# Patient Record
Sex: Female | Born: 1960 | Hispanic: Yes | Smoking: Never smoker
Health system: Southern US, Community
[De-identification: ages and names within clinical notes are randomized; demographics above are authoritative.]

## PROBLEM LIST (undated history)

## (undated) DIAGNOSIS — R519 Headache, unspecified: Secondary | ICD-10-CM

## (undated) DIAGNOSIS — G35 Multiple sclerosis: Secondary | ICD-10-CM

## (undated) DIAGNOSIS — J45909 Unspecified asthma, uncomplicated: Secondary | ICD-10-CM

## (undated) DIAGNOSIS — M810 Age-related osteoporosis without current pathological fracture: Secondary | ICD-10-CM

## (undated) DIAGNOSIS — N200 Calculus of kidney: Secondary | ICD-10-CM

## (undated) DIAGNOSIS — K219 Gastro-esophageal reflux disease without esophagitis: Secondary | ICD-10-CM

## (undated) HISTORY — DX: Age-related osteoporosis without current pathological fracture: M81.0

## (undated) HISTORY — DX: Multiple sclerosis: G35

## (undated) HISTORY — DX: Headache, unspecified: R51.9

## (undated) HISTORY — PX: HYSTERECTOMY: SHX81

## (undated) HISTORY — DX: Unspecified asthma, uncomplicated: J45.909

## (undated) HISTORY — DX: Gastro-esophageal reflux disease without esophagitis: K21.9

## (undated) HISTORY — PX: ABDOMINAL HYSTERECTOMY: SHX81

---

## 1985-10-21 HISTORY — PX: PR ANES; ANESTH, CS HYSTERECTOMY: AN01963

## 1998-11-23 ENCOUNTER — Emergency Department (HOSPITAL_COMMUNITY): Admission: EM | Admit: 1998-11-23 | Discharge: 1998-11-23 | Payer: Self-pay | Admitting: Emergency Medicine

## 2002-07-21 ENCOUNTER — Ambulatory Visit (HOSPITAL_COMMUNITY): Admission: RE | Admit: 2002-07-21 | Discharge: 2002-07-21 | Payer: Self-pay | Admitting: Family Medicine

## 2002-07-21 ENCOUNTER — Encounter: Payer: Self-pay | Admitting: Family Medicine

## 2002-11-19 ENCOUNTER — Ambulatory Visit (HOSPITAL_COMMUNITY): Admission: RE | Admit: 2002-11-19 | Discharge: 2002-11-19 | Payer: Self-pay | Admitting: Neurology

## 2002-12-07 ENCOUNTER — Ambulatory Visit (HOSPITAL_COMMUNITY): Admission: RE | Admit: 2002-12-07 | Discharge: 2002-12-07 | Payer: Self-pay | Admitting: Neurology

## 2005-05-24 ENCOUNTER — Ambulatory Visit: Payer: Self-pay | Admitting: Cardiology

## 2005-06-27 ENCOUNTER — Encounter: Admission: RE | Admit: 2005-06-27 | Discharge: 2005-09-25 | Payer: Self-pay | Admitting: Family Medicine

## 2009-04-01 ENCOUNTER — Emergency Department (HOSPITAL_COMMUNITY): Admission: EM | Admit: 2009-04-01 | Discharge: 2009-04-01 | Payer: Self-pay | Admitting: Emergency Medicine

## 2011-10-22 HISTORY — PX: PR CHOLECYSTECTOMY: 47600

## 2013-03-20 ENCOUNTER — Encounter (HOSPITAL_COMMUNITY): Payer: Self-pay | Admitting: *Deleted

## 2013-03-20 ENCOUNTER — Emergency Department (HOSPITAL_COMMUNITY): Payer: BC Managed Care – PPO

## 2013-03-20 ENCOUNTER — Emergency Department (HOSPITAL_COMMUNITY)
Admission: EM | Admit: 2013-03-20 | Discharge: 2013-03-20 | Disposition: A | Discharge status: Home / Self Care | Payer: BC Managed Care – PPO | Attending: Emergency Medicine | Admitting: Emergency Medicine

## 2013-03-20 DIAGNOSIS — Z90710 Acquired absence of both cervix and uterus: Secondary | ICD-10-CM | POA: Insufficient documentation

## 2013-03-20 DIAGNOSIS — R5381 Other malaise: Secondary | ICD-10-CM | POA: Insufficient documentation

## 2013-03-20 DIAGNOSIS — Z880 Allergy status to penicillin: Secondary | ICD-10-CM | POA: Insufficient documentation

## 2013-03-20 DIAGNOSIS — Z87442 Personal history of urinary calculi: Secondary | ICD-10-CM | POA: Insufficient documentation

## 2013-03-20 DIAGNOSIS — R109 Unspecified abdominal pain: Secondary | ICD-10-CM | POA: Insufficient documentation

## 2013-03-20 DIAGNOSIS — R42 Dizziness and giddiness: Secondary | ICD-10-CM | POA: Insufficient documentation

## 2013-03-20 DIAGNOSIS — J45909 Unspecified asthma, uncomplicated: Secondary | ICD-10-CM | POA: Insufficient documentation

## 2013-03-20 DIAGNOSIS — Z8669 Personal history of other diseases of the nervous system and sense organs: Secondary | ICD-10-CM | POA: Insufficient documentation

## 2013-03-20 HISTORY — DX: Calculus of kidney: N20.0

## 2013-03-20 LAB — CBC WITH DIFFERENTIAL/PLATELET
Basophils Absolute: 0 10*3/uL (ref 0.0–0.1)
Basophils Relative: 0 % (ref 0–1)
Eosinophils Absolute: 0.1 10*3/uL (ref 0.0–0.7)
Eosinophils Relative: 2 % (ref 0–5)
HCT: 36.8 % (ref 36.0–46.0)
Hemoglobin: 12.6 g/dL (ref 12.0–15.0)
Lymphocytes Relative: 51 % — ABNORMAL HIGH (ref 12–46)
Lymphs Abs: 2.5 10*3/uL (ref 0.7–4.0)
MCH: 30.2 pg (ref 26.0–34.0)
MCHC: 34.2 g/dL (ref 30.0–36.0)
MCV: 88.2 fL (ref 78.0–100.0)
Monocytes Absolute: 0.3 10*3/uL (ref 0.1–1.0)
Monocytes Relative: 7 % (ref 3–12)
Neutro Abs: 2 10*3/uL (ref 1.7–7.7)
Neutrophils Relative %: 40 % — ABNORMAL LOW (ref 43–77)
Platelets: 240 10*3/uL (ref 150–400)
RBC: 4.17 MIL/uL (ref 3.87–5.11)
RDW: 12.9 % (ref 11.5–15.5)
WBC: 4.9 10*3/uL (ref 4.0–10.5)

## 2013-03-20 LAB — URINALYSIS, ROUTINE W REFLEX MICROSCOPIC
Bilirubin Urine: NEGATIVE
Glucose, UA: NEGATIVE mg/dL
Hgb urine dipstick: NEGATIVE
Ketones, ur: NEGATIVE mg/dL
Leukocytes, UA: NEGATIVE
Nitrite: NEGATIVE
Protein, ur: NEGATIVE mg/dL
Specific Gravity, Urine: 1.025 (ref 1.005–1.030)
Urobilinogen, UA: 0.2 mg/dL (ref 0.0–1.0)
pH: 6 (ref 5.0–8.0)

## 2013-03-20 LAB — COMPREHENSIVE METABOLIC PANEL
ALT: 16 U/L (ref 0–35)
AST: 17 U/L (ref 0–37)
Albumin: 3.6 g/dL (ref 3.5–5.2)
Alkaline Phosphatase: 72 U/L (ref 39–117)
BUN: 10 mg/dL (ref 6–23)
CO2: 25 mEq/L (ref 19–32)
Calcium: 8.7 mg/dL (ref 8.4–10.5)
Chloride: 107 mEq/L (ref 96–112)
Creatinine, Ser: 0.46 mg/dL — ABNORMAL LOW (ref 0.50–1.10)
GFR calc Af Amer: >90 mL/min (ref >90)
GFR calc non Af Amer: >90 mL/min (ref >90)
Glucose, Bld: 96 mg/dL (ref 70–99)
Potassium: 3.7 mEq/L (ref 3.5–5.1)
Sodium: 141 mEq/L (ref 135–145)
Total Bilirubin: 0.1 mg/dL — ABNORMAL LOW (ref 0.3–1.2)
Total Protein: 6.7 g/dL (ref 6.0–8.3)

## 2013-03-20 LAB — LIPASE, BLOOD: Lipase: 67 U/L — ABNORMAL HIGH (ref 11–59)

## 2013-03-20 MED ORDER — ONDANSETRON HCL 4 MG/2ML IJ SOLN: 4.0000 mg | Freq: Once | INTRAMUSCULAR | Status: AC

## 2013-03-20 MED ORDER — FAMOTIDINE 20 MG PO TABS: 20.0000 mg | ORAL_TABLET | Freq: Two times a day (BID) | ORAL | Status: DC

## 2013-03-20 MED ORDER — IOHEXOL 300 MG/ML  SOLN
50.0000 mL | Freq: Once | INTRAMUSCULAR | Status: AC | PRN
  Administered 2013-03-20: 50 mL via ORAL

## 2013-03-20 MED ORDER — ONDANSETRON HCL 4 MG/2ML IJ SOLN
INTRAMUSCULAR | Status: AC
  Administered 2013-03-20: 4 mg via INTRAVENOUS
  Filled 2013-03-20: qty 2

## 2013-03-20 MED ORDER — SODIUM CHLORIDE 0.9 % IV BOLUS (SEPSIS)
1000.0000 mL | Freq: Once | INTRAVENOUS | Status: AC
  Administered 2013-03-20: 1000 mL via INTRAVENOUS

## 2013-03-20 MED ORDER — TRAMADOL HCL 50 MG PO TABS: 50.0000 mg | ORAL_TABLET | Freq: Four times a day (QID) | ORAL | Status: DC | PRN

## 2013-03-20 MED ORDER — HYDROMORPHONE HCL PF 1 MG/ML IJ SOLN: 1.0000 mg | Freq: Once | INTRAMUSCULAR | Status: DC

## 2013-03-20 MED ORDER — IOHEXOL 300 MG/ML  SOLN
100.0000 mL | Freq: Once | INTRAMUSCULAR | Status: AC | PRN
  Administered 2013-03-20: 100 mL via INTRAVENOUS

## 2013-03-20 MED ORDER — HYDROMORPHONE HCL PF 1 MG/ML IJ SOLN
1.0000 mg | Freq: Once | INTRAMUSCULAR | Status: AC
  Administered 2013-03-20: 1 mg via INTRAVENOUS
  Filled 2013-03-20: qty 1

## 2013-03-20 NOTE — ED Notes (Signed)
Pt c/o right flank pain that radiates around to right abd area. Associated with nausea, has hx of kidney stones

## 2013-03-20 NOTE — ED Notes (Signed)
Verbal order for 1 mg Dilaudid obtained from Dr Hyacinth Meeker.

## 2013-03-20 NOTE — ED Notes (Signed)
Assisted patient to restroom. Returned to room. No distress noted.

## 2013-03-20 NOTE — ED Provider Notes (Signed)
History    This chart was scribed for Vida Roller, MD by Leone Payor, ED Scribe. This patient was seen in room [facility]18[facility] and the patient's care was started 11:58 AM.   CSN: [id number]  Arrival date & time 03/20/13  1106   First MD Initiated Contact with Patient 03/20/13 1130      Chief Complaint  Patient presents with  . Flank Pain     The history is provided by the patient. No language interpreter was used.    HPI Comments: Ariel Burns is a 52 y.o. female who presents to the Emergency Department complaining of gradual onset, persistent RUQ and R flank pain that started 2-3 days ago. States the pain worsened with eating this morning. Pt has h/o MS, asthma, abdominal surgery (hysterectomy), kidney stones. She denies having h/o kidney infection. No prior abd surgery other than hysterectomy.  No alcohol use.      Past Medical History  Diagnosis Date  . Kidney stones     Past Surgical History  Procedure Laterality Date  . Abdominal hysterectomy      No family history on file.  History  Substance Use Topics  . Smoking status: Not on file  . Smokeless tobacco: Not on file  . Alcohol Use: No    OB History   Grav Para Term Preterm Abortions TAB SAB Ect Mult Living                  Review of Systems A complete 10 system review of systems was obtained and all systems are negative except as noted in the HPI and PMH.   Allergies  Penicillins  Home Medications   Current Outpatient Rx  Name  Route  Sig  Dispense  Refill  . famotidine (PEPCID) 20 MG tablet   Oral   Take 1 tablet (20 mg total) by mouth 2 (two) times daily.   30 tablet   0   . traMADol (ULTRAM) 50 MG tablet   Oral   Take 1 tablet (50 mg total) by mouth every 6 (six) hours as needed for pain.   15 tablet   0     BP 122/54  Pulse 67  Temp(Src) 98.2 F (36.8 C) (Oral)  Resp 20  Ht 5\' 2"  (1.575 m)  Wt 120 lb (54.432 kg)  BMI 21.94 kg/m2  SpO2 99%  Physical Exam  Nursing note and  vitals reviewed. Constitutional: She is oriented to person, place, and time. She appears well-developed and well-nourished. No distress.  HENT:  Head: Normocephalic and atraumatic.  Eyes: EOM are normal.  Neck: Neck supple. No tracheal deviation present.  Cardiovascular: Normal rate, regular rhythm and normal heart sounds.   Pulmonary/Chest: Effort normal and breath sounds normal. No respiratory distress.  Abdominal: Soft. There is tenderness. There is guarding.  Epigastric and RUQ tenderness with guarding. Soft and non-peritoneal. No HSM.   Musculoskeletal: Normal range of motion.  Neurological: She is alert and oriented to person, place, and time.  Skin: Skin is warm and dry.  Psychiatric: She has a normal mood and affect. Her behavior is normal.    ED Course  Procedures (including critical care time)  DIAGNOSTIC STUDIES: Oxygen Saturation is 99% on room air, normal by my interpretation.    COORDINATION OF CARE: 11:55 AM Discussed treatment plan with pt at bedside and pt agreed to plan.   Labs Reviewed  CBC WITH DIFFERENTIAL - Abnormal; Notable for the following:    Neutrophils Relative % 40 (*)  Lymphocytes Relative 51 (*)    All other components within normal limits  COMPREHENSIVE METABOLIC PANEL - Abnormal; Notable for the following:    Creatinine, Ser 0.46 (*)    Total Bilirubin 0.1 (*)    All other components within normal limits  LIPASE, BLOOD - Abnormal; Notable for the following:    Lipase 67 (*)    All other components within normal limits  URINALYSIS, ROUTINE W REFLEX MICROSCOPIC   Ct Abdomen Pelvis W Contrast  03/20/2013   *RADIOLOGY REPORT*  Clinical Data: Right-sided abdominal/flank pain.  History multiple sclerosis.  History kidney stones and prior hysterectomy.  CT ABDOMEN AND PELVIS WITH CONTRAST  Technique:  Multidetector CT imaging of the abdomen and pelvis was performed following the standard protocol during bolus administration of intravenous contrast.   Contrast: 50mL OMNIPAQUE IOHEXOL 300 MG/ML  SOLN, OMNIPAQUE IOHEXOL 300 MG/ML  SOLN  Comparison: None  Findings: Lung bases:  Mild motion degradation at the lung bases. Heart size upper normal, without pericardial or pleural effusion. The distal esophagus is mildly dilated and contrast filled on image 4.  Abdomen/pelvis:  Scattered well-circumscribed tiny low density liver lesions which are likely cysts or bile duct hamartomas. Normal spleen, stomach, pancreas, gallbladder, biliary tract, adrenal glands, left kidney.  Lower pole right renal cyst of 1.1 cm.  Aortic atherosclerosis. No retroperitoneal or retrocrural adenopathy.  Scattered colonic diverticula.  Normal terminal ileum and appendix. Normal small bowel without abdominal ascites.    No pelvic adenopathy.    Normal urinary bladder.  Hysterectomy. No adnexal mass.  No significant free fluid.  Bones/Musculoskeletal:  Mild osteopenia.  IMPRESSION: No acute process or explanation for right-sided abdominal pain.  Dilated fluid-filled lower thoracic esophagus, suggesting dysmotility or gastroesophageal reflux.   Original Report Authenticated By: Jeronimo Greaves, M.D.     1. Abdominal pain       MDM  On repeat exam, more soft, less tender, well appearing, nrmal VS and normal labs - CT shows no signs of acute surgical problems.  i have given pt dilaudid but she became very weak and dizzy with this - will give pt ultram for home as needed pepcid for GERD / gastritis and have f/u.  She has expressed unerstanding.  Meds given in ED:  Medications  HYDROmorphone (DILAUDID) injection 1 mg (1 mg Intravenous Given 03/20/13 1216)  sodium chloride 0.9 % bolus 1,000 mL (0 mLs Intravenous Stopped 03/20/13 1331)  ondansetron (ZOFRAN) injection 4 mg (4 mg Intravenous Given 03/20/13 1223)  iohexol (OMNIPAQUE) 300 MG/ML solution 50 mL (50 mLs Oral Contrast Given 03/20/13 1502)  iohexol (OMNIPAQUE) 300 MG/ML solution 100 mL (100 mLs Intravenous Contrast Given  03/20/13 1502)    New Prescriptions   FAMOTIDINE (PEPCID) 20 MG TABLET    Take 1 tablet (20 mg total) by mouth 2 (two) times daily.   TRAMADOL (ULTRAM) 50 MG TABLET    Take 1 tablet (50 mg total) by mouth every 6 (six) hours as needed for pain.      I personally performed the services described in this documentation, which was scribed in my presence. The recorded information has been reviewed and is accurate.      Vida Roller, MD 03/20/13 [phone]

## 2013-03-20 NOTE — ED Notes (Signed)
Patient stating shortness of breath after receiving Dilaudid IV, no hives noted. VSS, patient placed on monitor. NSR at 72 bpm. Patient moaning, but states her pain is better. MD aware. No new orders received.

## 2013-03-20 NOTE — ED Notes (Signed)
Patient with no complaints at this time. Respirations even and unlabored. Skin warm/dry. Discharge instructions reviewed with patient at this time. Patient given opportunity to voice concerns/ask questions. IV removed per policy and band-aid applied to site. Patient discharged at this time and left Emergency Department via wheelchair.  

## 2013-03-20 NOTE — ED Notes (Signed)
Pt c/o nausea after dilaudid given. MD aware. Verbal order for 4 mg Zofran obtained.

## 2013-03-29 ENCOUNTER — Ambulatory Visit (INDEPENDENT_AMBULATORY_CARE_PROVIDER_SITE_OTHER): Payer: BC Managed Care – PPO | Admitting: Family Medicine

## 2013-03-29 ENCOUNTER — Encounter: Payer: Self-pay | Admitting: Family Medicine

## 2013-03-29 VITALS — BP 107/63 | HR 66 | Temp 97.3°F | Ht 61.0 in | Wt 124.2 lb

## 2013-03-29 DIAGNOSIS — G8929 Other chronic pain: Secondary | ICD-10-CM

## 2013-03-29 DIAGNOSIS — R1011 Right upper quadrant pain: Secondary | ICD-10-CM

## 2013-03-29 MED ORDER — TRAMADOL HCL 50 MG PO TABS: 50.0000 mg | ORAL_TABLET | Freq: Four times a day (QID) | ORAL | Status: DC | PRN

## 2013-03-29 NOTE — Patient Instructions (Addendum)
Colecistitis   (Cholecystitis)   La colecistitis es la inflamación de la vesícula biliar. Generalmente la causa es la formación de cálculos biliares o sedimentos (colelitiasis)) en la vesícula. La vesícula almacena un líquido que ayuda a digerir las grasas (bilis). La colecistitis es una enfermedad grave y requiere tratamiento inmediato.   CAUSAS   · Cálculos biliares. Los cálculos biliares pueden obstruir el conducto que conduce a la vesícula biliar, causando la acumulación de bilis. Cuando la bilis se acumula, la vesícula biliar se inflama.  · Problemas en el conducto biliar, como la obstrucción por cicatrización o torsión.  · Tumores. Los tumores pueden impedir que la bilis salga de la vesícula adecuadamente, causando la acumulación de la misma. Cuando la bilis se acumula, la vesícula se inflama.  SÍNTOMAS   · Náuseas  · Vómitos.  · Dolor abdominal, especialmente en la zona superior derecha del abdomen.  · Sensibilidad o hinchazón abdominal.  · Sudoración.  · Escalofríos.  · Fiebre.  · Color amarillo de la piel y en la zona blanca del ojo (ictericia).  DIAGNÓSTICO   Su médico puede indicar exámenes de sangre para detectar una infección o problemas en la vesícula biliar. También puede ordenar pruebas de diagnóstico por imágenes, como ecografías o tomografía computada (CT). Otras pruebas pueden incluir un estudio de gammagrafía hepatobiliar con ácido iminodiacético (HIDA). Esta exploración permite a su médico ver el paso de la bilis desde el hígado hasta la vesícula biliar y el intestino delgado.   TRATAMIENTO   La hospitalización suele ser necesaria para disminuir la inflamación de la vesícula biliar. Posiblemente le indiquen que no coma ni beba nada (ayuno) durante cierto período de tiempo. Podrán indicarle un medicamento para calmar el dolor o un antibiótico para tratar la infección. Puede ser necesario realizar una cirugía para extirpar la vesícula biliar (colecistectomía) cuando la inflamación haya disminuído.  Podría necesitar de inmediato una cirugía si aparecen complicaciones como la muerte del tejido de la vesícula biliar (gangrena) o la ruptura (perforación)) de la vesícula biliar.   INSTRUCCIONES PARA EL CUIDADO EN EL HOGAR   El cuidado en el hogar dependerá del tipo de tratamiento. En general:   · Si le han recetado antibióticos, tómelos según las indicaciones. Tómelos todos, aunque se sienta mejor.  · Solo tome medicamentos de venta libre o recetados para el dolor, malestar o fiebre, según las indicaciones del médico.  · Siga una dieta baja en grasas hasta que vuelva a ver al médico nuevamente.  · Cumpla con todas las visitas de control, según le indique su médico.  SOLICITE ATENCIÓN MÉDICA DE INMEDIATO SI:   · El dolor aumenta y no puede controlarlo con los medicamentos.  · El dolor se traslada hacia alguna otra zona del abdomen o hacia la espalda.  · Tiene fiebre.  · Tiene náuseas o vómitos.  ASEGÚRESE DE QUE:   · Comprende estas instrucciones.  · Controlará su enfermedad.  · Solicitará ayuda de inmediato si no mejora o si empeora.  Document Released: 07/17/2005 Document Revised: 12/30/2011  [employer]® Patient Information ©2014 [employer].

## 2013-03-29 NOTE — Progress Notes (Signed)
  Subjective:    Patient ID: Ariel Burns, female    DOB: Dec 09, 1960, 52 y.o.   MRN: [id number]  HPI This 52 y.o. female presents for evaluation of abdominal pain.  She was seen in the ED a few weeks ago and was diagnosed with gastritis.  She was rx'd tramadol and pepcid.  She states she is still having abdominal pain which radiates to her right abdomen and happens after eating.  She is denying any fever but c/o nausea.    Review of Systems  Constitutional: Positive for fatigue. Negative for fever.  HENT: Negative.   Eyes: Negative.   Respiratory: Negative.   Cardiovascular: Negative.   Gastrointestinal: Positive for nausea and abdominal pain.        Objective:   Physical Exam Vital signs noted  Well developed well nourished female.  HEENT - Head atraumatic Normocephalic                Eyes - PERRLA, Conjuctiva - clear Sclera- Clear EOMI                Ears - EAC's Wnl TM's Wnl Gross Hearing WNL                Nose - Nares patent                 Throat - oropharanx wnl Respiratory - Lungs CTA bilateral Cardiac - RRR S1 and S2 without murmur GI - Positive Murphy's soft otherwise. Extremities - No edema. Neuro - Grossly intact.        Assessment & Plan:  Abdominal pain, chronic, right upper quadrant - Plan: traMADol (ULTRAM) 50 MG tablet, US Abdomen Limited RUQ  Discussed with patient that if her pain is persistent and worsens or if she gets fever then go to ED.  Follow up in one month

## 2013-03-31 ENCOUNTER — Ambulatory Visit (HOSPITAL_COMMUNITY)
Admission: RE | Admit: 2013-03-31 | Discharge: 2013-03-31 | Disposition: A | Discharge status: Home / Self Care | Payer: BC Managed Care – PPO | Source: Ambulatory Visit | Attending: Family Medicine | Admitting: Family Medicine

## 2013-03-31 ENCOUNTER — Other Ambulatory Visit: Payer: Self-pay | Admitting: Family Medicine

## 2013-03-31 DIAGNOSIS — G8929 Other chronic pain: Secondary | ICD-10-CM

## 2013-03-31 DIAGNOSIS — K8020 Calculus of gallbladder without cholecystitis without obstruction: Secondary | ICD-10-CM | POA: Insufficient documentation

## 2013-03-31 DIAGNOSIS — R1011 Right upper quadrant pain: Secondary | ICD-10-CM | POA: Insufficient documentation

## 2013-03-31 DIAGNOSIS — K7689 Other specified diseases of liver: Secondary | ICD-10-CM | POA: Insufficient documentation

## 2013-04-08 ENCOUNTER — Ambulatory Visit (INDEPENDENT_AMBULATORY_CARE_PROVIDER_SITE_OTHER): Payer: BC Managed Care – PPO | Admitting: General Surgery

## 2013-04-12 DIAGNOSIS — Z8719 Personal history of other diseases of the digestive system: Secondary | ICD-10-CM | POA: Insufficient documentation

## 2013-04-12 DIAGNOSIS — G35D Multiple sclerosis, unspecified: Secondary | ICD-10-CM | POA: Insufficient documentation

## 2013-04-12 DIAGNOSIS — G35 Multiple sclerosis: Secondary | ICD-10-CM | POA: Insufficient documentation

## 2013-04-12 DIAGNOSIS — J45909 Unspecified asthma, uncomplicated: Secondary | ICD-10-CM | POA: Insufficient documentation

## 2013-04-12 DIAGNOSIS — K8020 Calculus of gallbladder without cholecystitis without obstruction: Secondary | ICD-10-CM | POA: Insufficient documentation

## 2013-04-28 ENCOUNTER — Encounter: Payer: Self-pay | Admitting: Family Medicine

## 2013-04-28 ENCOUNTER — Ambulatory Visit (INDEPENDENT_AMBULATORY_CARE_PROVIDER_SITE_OTHER): Payer: BC Managed Care – PPO | Admitting: Family Medicine

## 2013-04-28 VITALS — BP 104/64 | HR 65 | Temp 97.6°F | Wt 118.8 lb

## 2013-04-28 DIAGNOSIS — H11001 Unspecified pterygium of right eye: Secondary | ICD-10-CM

## 2013-04-28 DIAGNOSIS — M549 Dorsalgia, unspecified: Secondary | ICD-10-CM

## 2013-04-28 DIAGNOSIS — H11009 Unspecified pterygium of unspecified eye: Secondary | ICD-10-CM

## 2013-04-28 MED ORDER — HYDROCODONE-ACETAMINOPHEN 5-325 MG PO TABS: 1.0000 | ORAL_TABLET | Freq: Four times a day (QID) | ORAL | Status: DC | PRN

## 2013-04-28 MED ORDER — NAPROXEN 500 MG PO TABS
500.0000 mg | ORAL_TABLET | Freq: Two times a day (BID) | ORAL | Status: DC
Start: 2013-04-28 — End: 2013-07-29

## 2013-04-28 NOTE — Progress Notes (Signed)
  Subjective:    Patient ID: Ariel Burns, female    DOB: 11/23/1960, 52 y.o.   MRN: [id number]  HPI  This 52 y.o. female presents for evaluation of back pain and s/p cholycystectomy.  She had her cholycystectomy on 04/13/13.  She has been having some right abdominal and back pain.  She Has been having some indigestion.  She doesn't feel like she can go back to work for a few more  Weeks.   Her job requires her to do a lot of lifting.  She has a pytergium OD and needs to get surgery on This and wants to wait.  Review of Systems    No chest pain, SOB, HA, dizziness, vision change, N/V, diarrhea, constipation, dysuria, urinary urgency or frequency, myalgias, arthralgias or rash.  Objective:   Physical Exam  Vital signs noted  Well developed well nourished female.  HEENT - Head atraumatic Normocephalic                Eyes - PERRLA, Conjuctiva - clear Sclera- Clear, OD with pytergium  EOMI                Ears - EAC's Wnl TM's Wnl Gross Hearing WNL                Nose - Nares patent                 Throat - oropharanx wnl Respiratory - Lungs CTA bilateral Cardiac - RRR S1 and S2 without murmur GI - Abdomen soft tender right upper quadrant, and bowel sounds active x 4 Extremities - No edema. Neuro - Grossly intact. MS-TTP LS spine.      Assessment & Plan:  Back pain - Plan: HYDROcodone-acetaminophen (NORCO/VICODIN) 5-325 MG per tablet, naproxen (NAPROSYN) 500 MG tablet x 10 days Work excuse given.  Discussed patient follow up if not better.  S/P cholycystectomy -  She still is having some post operative healing but is doing well.  Advised her to see her Surgeon on 05/07/13.  Pterygium - Advised her to go see her opthamologist and get this taken care of when better.

## 2013-04-28 NOTE — Patient Instructions (Signed)
Pterygium Excision Pterygia are fleshy growths that arise from the conjuctiva. This is the red velvety membrane you see when you pull your lower eyelid down. When they grow out over the cornea (clear membrane on the front of your eye), they block vision. It becomes difficult to see. It may also cause irritation, making the eye red and sore. It also causes cosmetic problems. This means your eye does not look as good as when it was healthy. One of the most common problems with pterygia are that they often come back even after complete removal. TREATMENT  Pterygia are removed with a procedure. This is often done with a local anesthetic. This is a medicine that makes the eye and area being worked on numb. They are often removed using a microscope. This is an instrument the surgeon looks through that magnifies the small area of the procedure. When these operations are done with the patient awake, the patient must be able to hold still and cooperate with the surgeon's instructions. HOME CARE INSTRUCTIONS   If a dressing was applied, this may be changed once per day or as instructed. Your caregiver will instruct you in your care.  If eyedrops or ointment was prescribed, use as directed for the full time directed.  Should your eye become more red and swollen with use of medicines, let your caregiver know. This could be an allergic reaction.  Only take over-the-counter or prescription medicines for pain, discomfort, or fever as directed by your caregiver. SEEK IMMEDIATE MEDICAL CARE IF:   You have redness, swelling, or increasing pain near or around the eye.  You notice a change in your vision.  You have pus coming from the wound.  You have a fever.  You develop a cough, shortness of breath, or chest pain. Document Released: 07/02/2001 Document Revised: 12/30/2011 Document Reviewed: 09/03/2007 [facility] Patient Information 2014 [place], [place].

## 2013-05-10 ENCOUNTER — Ambulatory Visit (INDEPENDENT_AMBULATORY_CARE_PROVIDER_SITE_OTHER): Payer: BC Managed Care – PPO | Admitting: General Surgery

## 2013-05-12 HISTORY — PX: CHOLECYSTECTOMY: SHX55

## 2013-07-29 ENCOUNTER — Ambulatory Visit (INDEPENDENT_AMBULATORY_CARE_PROVIDER_SITE_OTHER): Payer: BC Managed Care – PPO | Admitting: Family Medicine

## 2013-07-29 ENCOUNTER — Encounter: Payer: Self-pay | Admitting: Family Medicine

## 2013-07-29 ENCOUNTER — Ambulatory Visit (INDEPENDENT_AMBULATORY_CARE_PROVIDER_SITE_OTHER): Payer: BC Managed Care – PPO

## 2013-07-29 VITALS — BP 115/70 | HR 58 | Temp 97.0°F | Ht 61.0 in | Wt 122.0 lb

## 2013-07-29 DIAGNOSIS — M129 Arthropathy, unspecified: Secondary | ICD-10-CM

## 2013-07-29 DIAGNOSIS — M1990 Unspecified osteoarthritis, unspecified site: Secondary | ICD-10-CM

## 2013-07-29 DIAGNOSIS — M542 Cervicalgia: Secondary | ICD-10-CM

## 2013-07-29 DIAGNOSIS — R5381 Other malaise: Secondary | ICD-10-CM

## 2013-07-29 DIAGNOSIS — R232 Flushing: Secondary | ICD-10-CM

## 2013-07-29 DIAGNOSIS — N951 Menopausal and female climacteric states: Secondary | ICD-10-CM

## 2013-07-29 DIAGNOSIS — M549 Dorsalgia, unspecified: Secondary | ICD-10-CM

## 2013-07-29 DIAGNOSIS — H11009 Unspecified pterygium of unspecified eye: Secondary | ICD-10-CM

## 2013-07-29 DIAGNOSIS — H11002 Unspecified pterygium of left eye: Secondary | ICD-10-CM

## 2013-07-29 MED ORDER — MELOXICAM 7.5 MG PO TABS: 7.5000 mg | ORAL_TABLET | Freq: Every day | ORAL | Status: DC

## 2013-07-29 NOTE — Patient Instructions (Signed)
Artritis inespecífica  (Arthritis, Nonspecific)  La artritis es la inflamación de una articulación. Los síntomas son dolor, enrojecimiento, calor o hinchazón. Pueden verse involucradas una o más articulaciones. Hay diferentes tipos de artritis. El médico no podrá diagnosticar inmediatamente cuál es el tipo de artritis que usted sufre.   CAUSAS  La causa más frecuente es el desgaste de la articulación (osteoartritis). Esto ocasiona lesiones en el cartílago, que puede romperse con el tiempo. Las zonas más afectadas por este tipo de artritis son las rodillas, caderas, espalda y cuello.  Otros tipos de artritis y causas frecuentes de dolor en la articulación son:  · Esguinces y otras lesiones cercanas a la articulación}. En algunos casos, esguinces y lesiones menores causan dolor e hinchazón que aparece horas más tarde.  · Artritis reumatoidea Afecta las manos, pies y rodillas. Generalmente afecta ambos lados del cuerpo al mismo tiempo. Generalmente se asocia a enfermedades crónicas, fiebre, pérdida de peso y debilidad general.  · Artritis por cristales. La gota y la pseudogota pueden causar dolor intenso agudo ocasional, enrojecimiento e hinchazón del pie, el tobillo o la rodilla.  · Artritis infecciosa. Las bacterias pueden penetrar en la articulación a través de una herida en la piel. Esto puede causar una infección en la articulación. Las bacterias y virus también pueden diseminarse a través del torrente sanguíneo y afectar las articulaciones.  · Reacciones a medicamentos, infecciosas y alérgicas. En algunos casos las articulaciones duelen levemente y están ligeramente hinchadas en este tipo de enfermedad.  SÍNTOMAS  · El dolor es el síntoma principal.  · La articulación también pueden verse roja, hinchada y caliente al tacto.  · En ciertos tipos de artritis hay fiebre o malestar general.  · En la articulación que presenta artritis sentirá dolor con el movimiento. En otros tipos de artritis hay  rigidez.  DIAGNÓSTICO:  El médico sospechará artritis basándose en la descripción de los síntomas y en el examen. Será necesario realizar pruebas para diagnosticar el tipo de artritis.  · Análisis de sangre y en algunos casos de orina.  · Radiografías y en algunos casos tomografía computada o diagnóstico por imágenes.  · La remoción del líquido de la articulación (artrocentesis) se realiza para controlar la presencia de bacterias, cristales o por otras causas. Su médico (o un especialista) adormecerán la zona de la articulación con un anestésico local y utilizarán una aguja para retirar líquido de la articulación para ser examinado. Este procedimiento es sólo mínimamente molesto.  · Aún con estas pruebas, el médico no podrá decir qué tipo de artritis usted sufre. La consulta con un especialista (reumatólogo) puede ser de utilidad.  TRATAMIENTO  El médico comentará con usted el tratamiento específico para su tipo de artritis. Si el tipo específico no puede determinarse, podrán aplicarse las siguientes recomendaciones generales.   El tratamiento para el dolor intenso de las articulaciones consiste en:  · Hacer reposo  · Elevar el miembro.  · Podrán prescribirle medicamentos antiinflamatorios (como ibuprofeno). Evite las actividades que aumenten el dolor.  · Sólo tome medicamentos de venta libre o prescriptos para calmar el dolor y las molestias, según las indicaciones de su médico.  · Puede aplicarse compresas frías sobre la articulación dolorida durante 10 a 15 minutos cada hora. Las compresas calientes también pueden ser beneficiosas, pero no las utilice durante la noche. No use compresas calientes sin autorización de su médico, si es diabético.  · Una inyección de corticoides en la articulación artrítica puede ayudar a reducir el dolor y la hinchazón.  ·   Si una artritis aguda empeora en los siguientes 1 ó 2 días, será necesario descartar una infección.  El tratamiento prolongado implica la modificación de  actividades y del estilo de vida para reducir el estrés en la articulación. Puede ser necesario que baje de peso. La actividad física es necesaria para nutrir el cartílago de la articulación y eliminar los desechos. Esto ayuda a mantener fuertes los músculos que rodean la articulación.  INSTRUCCIONES PARA EL CUIDADO DOMICILIARIO  · No tome aspirina para aliviar el dolor si se sospecha que sufre gota. Esto eleva los niveles de ácido úrico.  · Solo tome medicamentos que se pueden comprar sin receta o recetados para el dolor, malestar o fiebre, como le indica el médico.  · Haga reposo todo el tiempo que pueda.  · Si la articulación está hinchada, manténgala elevada.  · Utilice muletas si la articulación que le duele está en la pierna.  · Beber abundante cantidad de líquidos será beneficioso para ciertos tipos de artritis.  · Siga las indicaciones del profesional.  · La actividad física regular puede ser beneficiosa, incluyendo las actividades de bajo impacto como:  · Natación.  · Aquagym.  · Andar en bicicleta.  · Caminar.  · La rigidez matutina se alivia con una ducha caliente.  · También es beneficioso que realice ejercicios de amplitud de movimiento.  SOLICITE ATENCIÓN MÉDICA SI:  · No se siente mejor o empeora luego de las 24 horas.  · Presenta efectos adversos por los medicamentos y no mejora con el tratamiento.  SOLICITE ATENCIÓN MÉDICA INMEDIATAMENTE SI:  · Tiene fiebre.  · Presenta fiebre o dolor intenso, hinchazón o enrojecimiento.  · Muchas articulaciones están involucradas y están hinchadas y siente dolor.  · Tiene un dolor intenso en la espalda o siente debilidad en las piernas.  · Pierde el control de la vejiga o del intestino.  Document Released: 10/07/2005 Document Revised: 12/30/2011  [employer]® Patient Information ©2014 [employer].

## 2013-07-29 NOTE — Progress Notes (Signed)
  Subjective:    Patient ID: Ariel Burns, female    DOB: 1960-11-10, 52 y.o.   MRN: [id number]  HPI This 52 y.o. female presents for evaluation of back pain.   She has this On occasion.  She is c/o multiple arthralgias.  She has neck discomfort. She has hx of MS.  She has been having vision problems.  She has pterygium Of the left eye.  She has been having some visual changes.  She has MS and  States she is not having a MS crisis.  She has been scheduled an appointment In January to see her MS doctor.  She has seen a RA in the past but did not Have seropositive RA criteria to be followed by RA for her multiple arthralgias. She is due for Mammo.  She has hx of partial hysterctomy in her 47's.  She Has been having some hot flashes and menopausal sx's.   Review of Systems C/o polyarthralgias,back pain, fatigue, hot flashes, and vision changes. No chest pain, SOB, HA, dizziness,  N/V, diarrhea, constipation, dysuria, urinary urgency or rash.     Objective:   Physical Exam  Vital signs noted  Well developed well nourished female.  HEENT - Head atraumatic Normocephalic                Eyes - PERRLA, Conjuctiva - clear Sclera- pterygium left eye                Ears - EAC's Wnl TM's Wnl Gross Hearing WNL                Nose - Nares patent                 Throat - oropharanx wnl Respiratory - Lungs CTA bilateral Cardiac - RRR S1 and S2 without murmur GI - Abdomen soft Nontender and bowel sounds active x 4 Extremities - No edema. Neuro - Grossly intact. MS - TTP lumbar, cervical spine,  TTP bilateral elbows, wrists, shoulders, and myofascial region  Lumbar spine xray - no acute fx Cervical spine xray - no acute fx   Prelimnary reading by Chrissie Noa Thamas Appleyard,FNP Assessment & Plan:  Pterygium, left - Plan: Ambulatory referral to Ophthalmology  Arthritis - Plan: meloxicam (MOBIC) 7.5 MG tablet, DG Cervical Spine Complete, DG Lumbar Spine 2-3 Views  Other malaise and fatigue -  Plan: BMP8+EGFR, Arthritis Panel, Vit D  25 hydroxy (rtn osteoporosis monitoring), Thyroid Panel With TSH, CANCELED: POCT CBC  Hot flashes - Plan: Follicle Stimulating Hormone  Deatra Canter FNP

## 2013-07-30 ENCOUNTER — Other Ambulatory Visit: Payer: Self-pay | Admitting: Family Medicine

## 2013-07-30 DIAGNOSIS — M25559 Pain in unspecified hip: Secondary | ICD-10-CM

## 2013-07-30 LAB — ARTHRITIS PANEL
Anti Nuclear Antibody(ANA): POSITIVE — AB
Rhuematoid fact SerPl-aCnc: 8.3 IU/mL (ref 0.0–13.9)
Sed Rate: 3 mm/hr (ref 0–40)
Uric Acid: 3.5 mg/dL (ref 2.5–7.1)

## 2013-07-30 LAB — FOLLICLE STIMULATING HORMONE: FSH: 147.9 m[IU]/mL

## 2013-07-30 LAB — BMP8+EGFR
BUN/Creatinine Ratio: 22 (ref 9–23)
BUN: 13 mg/dL (ref 6–24)
CO2: 27 mmol/L (ref 18–29)
Calcium: 9.6 mg/dL (ref 8.7–10.2)
Chloride: 100 mmol/L (ref 97–108)
Creatinine, Ser: 0.6 mg/dL (ref 0.57–1.00)
GFR calc Af Amer: 121 mL/min/{1.73_m2} (ref >59)
GFR calc non Af Amer: 105 mL/min/{1.73_m2} (ref >59)
Glucose: 93 mg/dL (ref 65–99)
Potassium: 4.5 mmol/L (ref 3.5–5.2)
Sodium: 142 mmol/L (ref 134–144)

## 2013-07-30 LAB — THYROID PANEL WITH TSH
Free Thyroxine Index: 2.3 (ref 1.2–4.9)
T3 Uptake Ratio: 25 % (ref 24–39)
T4, Total: 9 ug/dL (ref 4.5–12.0)
TSH: 1.01 u[IU]/mL (ref 0.450–4.500)

## 2013-07-30 LAB — VITAMIN D 25 HYDROXY (VIT D DEFICIENCY, FRACTURES): Vit D, 25-Hydroxy: 24.9 ng/mL — ABNORMAL LOW (ref 30.0–100.0)

## 2013-08-06 ENCOUNTER — Encounter: Payer: Self-pay | Admitting: Family Medicine

## 2013-08-06 ENCOUNTER — Ambulatory Visit (INDEPENDENT_AMBULATORY_CARE_PROVIDER_SITE_OTHER): Payer: BC Managed Care – PPO | Admitting: Family Medicine

## 2013-08-06 VITALS — BP 106/68 | HR 66 | Temp 97.5°F | Ht 61.0 in | Wt 122.0 lb

## 2013-08-06 DIAGNOSIS — E559 Vitamin D deficiency, unspecified: Secondary | ICD-10-CM

## 2013-08-06 DIAGNOSIS — Z780 Asymptomatic menopausal state: Secondary | ICD-10-CM

## 2013-08-06 DIAGNOSIS — N951 Menopausal and female climacteric states: Secondary | ICD-10-CM

## 2013-08-06 MED ORDER — VITAMIN D (ERGOCALCIFEROL) 1.25 MG (50000 UNIT) PO CAPS: 50000.0000 [IU] | ORAL_CAPSULE | ORAL | Status: DC

## 2013-08-06 NOTE — Patient Instructions (Signed)

## 2013-08-06 NOTE — Progress Notes (Signed)
  Subjective:    Patient ID: Ariel Burns, female    DOB: 1960/12/04, 52 y.o.   MRN: [id number]  HPI This 52 y.o. female presents for evaluation of neck pain and arthritis.  She has had labs and xrays and  She is here for follow up.  She has positive ANA and has been referred to RA.  She has vitaminD deficiency And she has elevated FSH showing menopause status.  She does have some mild menopausal sx's. She is scheduled for mammogram.  She states the mobic is helping.   Review of Systems C/o arthritis No chest pain, SOB, HA, dizziness, vision change, N/V, diarrhea, constipation, dysuria, urinary urgency or frequencyor rash.     Objective:   Physical Exam Vital signs noted  Well developed well nourished female.  HEENT - Head atraumatic Normocephalic                Eyes - PERRLA, Conjuctiva - clear Sclera- Clear EOMI                Ears - EAC's Wnl TM's Wnl Gross Hearing WNL                Nose - Nares patent                 Throat - oropharanx wnl Respiratory - Lungs CTA bilateral Cardiac - RRR S1 and S2 without murmur GI - Abdomen soft Nontender and bowel sounds active x 4 Extremities - No edema. Neuro - Grossly intact.       Assessment & Plan:  Menopause - Plan: DG Bone Density.  Recommend oil of primrose or black co-hosh otc  Unspecified vitamin D deficiency - Plan: Vitamin D, Ergocalciferol, (DRISDOL) 50000 UNITS CAPS capsule And recommend she take vitamin D 1000 iu po qd otc.  Arthritis - Refer to RA.  Continue mobic which is working.  Deatra Canter FNP

## 2013-08-17 ENCOUNTER — Other Ambulatory Visit: Payer: Self-pay | Admitting: Family Medicine

## 2013-08-18 ENCOUNTER — Ambulatory Visit (INDEPENDENT_AMBULATORY_CARE_PROVIDER_SITE_OTHER): Payer: BC Managed Care – PPO

## 2013-08-18 ENCOUNTER — Encounter: Payer: Self-pay | Admitting: Family Medicine

## 2013-08-18 ENCOUNTER — Ambulatory Visit (INDEPENDENT_AMBULATORY_CARE_PROVIDER_SITE_OTHER): Payer: BC Managed Care – PPO | Admitting: Family Medicine

## 2013-08-18 VITALS — BP 112/64 | HR 67 | Temp 97.2°F | Ht 60.0 in | Wt 122.0 lb

## 2013-08-18 DIAGNOSIS — M1990 Unspecified osteoarthritis, unspecified site: Secondary | ICD-10-CM

## 2013-08-18 DIAGNOSIS — Z780 Asymptomatic menopausal state: Secondary | ICD-10-CM | POA: Insufficient documentation

## 2013-08-18 DIAGNOSIS — M899 Disorder of bone, unspecified: Secondary | ICD-10-CM

## 2013-08-18 DIAGNOSIS — N951 Menopausal and female climacteric states: Secondary | ICD-10-CM

## 2013-08-18 DIAGNOSIS — M129 Arthropathy, unspecified: Secondary | ICD-10-CM

## 2013-08-18 DIAGNOSIS — J029 Acute pharyngitis, unspecified: Secondary | ICD-10-CM

## 2013-08-18 DIAGNOSIS — M8580 Other specified disorders of bone density and structure, unspecified site: Secondary | ICD-10-CM | POA: Insufficient documentation

## 2013-08-18 LAB — POCT RAPID STREP A (OFFICE): Rapid Strep A Screen: NEGATIVE

## 2013-08-18 MED ORDER — MELOXICAM 7.5 MG PO TABS: 15.0000 mg | ORAL_TABLET | Freq: Every day | ORAL | Status: DC

## 2013-08-18 MED ORDER — AZITHROMYCIN 250 MG PO TABS: ORAL_TABLET | ORAL | Status: DC

## 2013-08-18 NOTE — Progress Notes (Signed)
  Subjective:    Patient ID: Ariel Burns, female    DOB: [birth year], 52 y.o.   MRN: [id number]  HPI This 52 y.o. female presents for evaluation of URI sx's for over a week.  She has been Having multiple arthralgias and has a positive ANA and has been referred to RA. She has recent BMD which shows Osteopenia.  She has seen clinical pharmacist who Is tx.  She has hx of MS.  She has been having problems with menopausal sx's and takes Black cohosh otc and she is sleeping better at hs.   Review of Systems C/o sore throat No chest pain, SOB, HA, dizziness, vision change, N/V, diarrhea, constipation, dysuria, urinary urgency or frequency, myalgias, arthralgias or rash.     Objective:   Physical Exam Vital signs noted  Well developed well nourished female.  HEENT - Head atraumatic Normocephalic                Eyes - PERRLA, Conjuctiva - clear Sclera- Clear EOMI                Ears - EAC's Wnl TM's Wnl Gross Hearing WNL                Nose - Nares patent                 Throat - oropharanx wnl Respiratory - Lungs CTA bilateral Cardiac - RRR S1 and S2 without murmur GI - Abdomen soft Nontender and bowel sounds active x 4 Extremities - No edema. Neuro - Grossly intact.       Assessment & Plan:  Acute pharyngitis - Plan: POCT rapid strep A, azithromycin (ZITHROMAX) 250 MG tablet  Arthritis - Plan: meloxicam (MOBIC) 7.5 MG tablet one to two po qd Follow up with RA.  Deatra Canter FNP

## 2013-08-18 NOTE — Progress Notes (Signed)
Patient ID: Ariel Burns, female   DOB: 1961-01-13, 52 y.o.   MRN: [id number]   [facility] Current Height: Height: 5' (152.4 cm)      Max Lifetime Height:  60" Current Weight: Weight: 122 lb (55.339 kg)       Ethnicity:Hispanic    HPI: Does pt already have a diagnosis of:  Osteopenia?  No Osteoporosis?  No  Back Pain?  Yes       Kyphosis?  No Prior fracture?  No Med(s) for Osteoporosis/Osteopenia:  none Med(s) previously tried for Osteoporosis/Osteopenia:  none                                                             PMH: Age at menopause:  Surgical at age 1 yo Hysterectomy?  Yes Oophorectomy?  no HRT? No Steroid Use?  No Thyroid med?  No History of cancer?  No History of digestive disorders (ie Crohn's)?  No Current or previous eating disorders?  No Last Vitamin D Result:  24.9 (07/29/2013) Last GFR Result:  105 (07/29/2013)   FH/SH: Family history of osteoporosis?  No Parent with history of hip fracture?  No Family history of breast cancer?  No Exercise?  Yes - yoga Smoking?  No Alcohol?  No    Calcium Assessment Calcium Intake  # of servings/day  Calcium mg  Milk (8 oz) 1  x  300  = 300mg   Yogurt (4 oz) 0.5 x  200 = 100mg   Cheese (1 oz) 0 x  200 = 0  Other Calcium sources   250mg   Ca supplement 0 = 0   Estimated calcium intake per day 650mg     DEXA Results Date of Test T-Score for AP Spine L1-L4 T-Score for Total Left Hip T-Score for Total Right Hip  08/18/2013 -2.1 -1.1 -1.2                  FRAX 10 year estimate: Total FX risk:  3.4%  (consider medication if >/= 20%) Hip FX risk:  0.5%  (consider medication if >/= 3%)  Assessment: Osteopenia with low fracture risk  Recommendations: 1.  DIscussed DEXA results and fracture risk 2.  recommend calcium 1200mg  daily through supplementation or diet.  3.  recommend weight bearing exercise - 30 minutes at least 4 days per week.   4.  Counseled and educated about fall risk and  prevention.  Recheck DEXA:  2 years  Time spent counseling patient:  20 minutes

## 2013-08-18 NOTE — Patient Instructions (Signed)

## 2013-11-06 ENCOUNTER — Other Ambulatory Visit: Payer: Self-pay | Admitting: Family Medicine

## 2013-11-19 ENCOUNTER — Ambulatory Visit: Payer: BC Managed Care – PPO | Admitting: Family Medicine

## 2014-10-31 ENCOUNTER — Other Ambulatory Visit (HOSPITAL_COMMUNITY): Payer: Self-pay | Admitting: Physician Assistant

## 2014-10-31 DIAGNOSIS — Z1231 Encounter for screening mammogram for malignant neoplasm of breast: Secondary | ICD-10-CM

## 2014-11-17 ENCOUNTER — Other Ambulatory Visit (HOSPITAL_BASED_OUTPATIENT_CLINIC_OR_DEPARTMENT_OTHER): Payer: Self-pay

## 2014-11-17 ENCOUNTER — Ambulatory Visit (HOSPITAL_COMMUNITY): Payer: Self-pay

## 2014-11-17 ENCOUNTER — Ambulatory Visit (HOSPITAL_COMMUNITY)
Admission: RE | Admit: 2014-11-17 | Discharge: 2014-11-17 | Disposition: A | Discharge status: Home / Self Care | Payer: Self-pay | Source: Ambulatory Visit | Attending: Physician Assistant | Admitting: Physician Assistant

## 2014-11-17 DIAGNOSIS — Z1231 Encounter for screening mammogram for malignant neoplasm of breast: Secondary | ICD-10-CM

## 2014-11-18 ENCOUNTER — Other Ambulatory Visit: Payer: Self-pay | Admitting: Physician Assistant

## 2014-11-18 DIAGNOSIS — R928 Other abnormal and inconclusive findings on diagnostic imaging of breast: Secondary | ICD-10-CM

## 2014-12-06 ENCOUNTER — Other Ambulatory Visit: Payer: Self-pay | Admitting: Physician Assistant

## 2014-12-06 DIAGNOSIS — R928 Other abnormal and inconclusive findings on diagnostic imaging of breast: Secondary | ICD-10-CM

## 2014-12-16 ENCOUNTER — Ambulatory Visit (HOSPITAL_COMMUNITY): Payer: Self-pay

## 2014-12-20 ENCOUNTER — Ambulatory Visit (HOSPITAL_COMMUNITY): Payer: Self-pay

## 2014-12-20 ENCOUNTER — Encounter (HOSPITAL_COMMUNITY): Payer: Self-pay

## 2015-01-03 ENCOUNTER — Other Ambulatory Visit (HOSPITAL_BASED_OUTPATIENT_CLINIC_OR_DEPARTMENT_OTHER): Payer: Self-pay

## 2015-01-03 ENCOUNTER — Ambulatory Visit (HOSPITAL_COMMUNITY)
Admission: RE | Admit: 2015-01-03 | Discharge: 2015-01-03 | Disposition: A | Discharge status: Home / Self Care | Payer: PRIVATE HEALTH INSURANCE | Source: Ambulatory Visit | Attending: Physician Assistant | Admitting: Physician Assistant

## 2015-01-03 DIAGNOSIS — R928 Other abnormal and inconclusive findings on diagnostic imaging of breast: Secondary | ICD-10-CM

## 2015-08-17 ENCOUNTER — Ambulatory Visit: Payer: Self-pay | Admitting: Physician Assistant

## 2015-08-24 ENCOUNTER — Encounter: Payer: Self-pay | Admitting: Physician Assistant

## 2015-08-24 ENCOUNTER — Ambulatory Visit: Payer: Self-pay | Admitting: Physician Assistant

## 2015-08-24 VITALS — BP 108/68 | HR 65 | Temp 97.5°F | Ht 61.0 in | Wt 124.0 lb

## 2015-08-24 DIAGNOSIS — R5383 Other fatigue: Secondary | ICD-10-CM

## 2015-08-24 DIAGNOSIS — R002 Palpitations: Secondary | ICD-10-CM

## 2015-08-24 LAB — CBC
HEMATOCRIT: 40.7 % (ref 36.0–46.0)
HEMOGLOBIN: 13.7 g/dL (ref 12.0–15.0)
MCH: 30.1 pg (ref 26.0–34.0)
MCHC: 33.7 g/dL (ref 30.0–36.0)
MCV: 89.5 fL (ref 78.0–100.0)
MPV: 10 fL (ref 8.6–12.4)
Platelets: 292 10*3/uL (ref 150–400)
RBC: 4.55 MIL/uL (ref 3.87–5.11)
RDW: 13.4 % (ref 11.5–15.5)
WBC: 5.6 10*3/uL (ref 4.0–10.5)

## 2015-08-24 NOTE — Progress Notes (Signed)
BP 108/68 mmHg  Pulse 65  Temp(Src) 97.5 F (36.4 C)  Ht  (1.549 m)  Wt 124 lb (56.246 kg)  BMI 23.44 kg/m2  SpO2 98%   Subjective:    Patient ID: Ariel Burns, female    DOB: Sep 11, 1961, 54 y.o.   MRN: [id number]  HPI: Ariel Burns is a 54 y.o. female presenting on 08/24/2015 for Fatigue and Alopecia   HPI  Pt is still going to [facility] for her MS Pt feels tired for 2 months.  She feels like her heart is beating too fast and she gets sob. She said the hair falling out also started 2 months ago.  Also she c/o intermittent diarrhea which started less than 2 months ago.  States 2-3 times week.  States 3 or 4 times/day.  She denies international travel in past 6 mo.   Pt denies anxiety  Relevant past medical, surgical, family and social history reviewed and updated as indicated. Interim medical history since our last visit reviewed. Allergies and medications reviewed and updated.   Current outpatient prescriptions:  .  celecoxib (CELEBREX) 200 MG capsule, Take 200 mg by mouth daily., Disp: , Rfl:    Review of Systems  Constitutional: Positive for fever and fatigue. Negative for chills, diaphoresis, appetite change and unexpected weight change.  HENT: Positive for dental problem. Negative for congestion, drooling, ear pain, facial swelling, hearing loss, mouth sores, sneezing, sore throat, trouble swallowing and voice change.   Eyes: Positive for redness and visual disturbance. Negative for pain, discharge and itching.  Respiratory: Positive for shortness of breath. Negative for cough, choking and wheezing.   Cardiovascular: Positive for palpitations. Negative for chest pain and leg swelling.  Gastrointestinal: Positive for diarrhea. Negative for vomiting, abdominal pain, constipation and blood in stool.  Endocrine: Positive for cold intolerance. Negative for heat intolerance and polydipsia.  Genitourinary: Negative for dysuria, hematuria and decreased urine  volume.  Musculoskeletal: Positive for arthralgias and gait problem. Negative for back pain.  Skin: Negative for rash.  Allergic/Immunologic: Negative for environmental allergies.  Neurological: Negative for seizures, syncope, light-headedness and headaches.  Hematological: Negative for adenopathy.  Psychiatric/Behavioral: Negative for suicidal ideas, dysphoric mood and agitation. The patient is not nervous/anxious.     Per HPI unless specifically indicated above     Objective:    BP 108/68 mmHg  Pulse 65  Temp(Src) 97.5 F (36.4 C)  Ht  (1.549 m)  Wt 124 lb (56.246 kg)  BMI 23.44 kg/m2  SpO2 98%  Wt Readings from Last 3 Encounters:  08/24/15 124 lb (56.246 kg)  08/18/13 122 lb (55.339 kg)  08/06/13 122 lb (55.339 kg)    Physical Exam  Constitutional: She is oriented to person, place, and time. She appears well-developed and well-nourished.  HENT:  Head: Normocephalic and atraumatic.  Neck: Neck supple.  Cardiovascular: Normal rate and regular rhythm.   Pulmonary/Chest: Effort normal and breath sounds normal.  Abdominal: Soft. Bowel sounds are normal. She exhibits no mass. There is no tenderness.  Lymphadenopathy:    She has no cervical adenopathy.  Neurological: She is alert and oriented to person, place, and time.  Skin: Skin is warm and dry.  Head hair thick and normal and without visible bald patches  Psychiatric: She has a normal mood and affect. Her behavior is normal.  Vitals reviewed.       Assessment & Plan:   Encounter Diagnoses  Name Primary?  . Other fatigue Yes  . Palpitations    -  reassured pt about hair -Send bentyl for diarrhea  -F/u 6 mo. rto sooner prn

## 2015-08-25 LAB — BASIC METABOLIC PANEL
BUN: 12 mg/dL (ref 7–25)
CO2: 29 mmol/L (ref 20–31)
CREATININE: 0.55 mg/dL (ref 0.50–1.05)
Calcium: 10 mg/dL (ref 8.6–10.4)
Chloride: 102 mmol/L (ref 98–110)
GLUCOSE: 88 mg/dL (ref 65–99)
POTASSIUM: 4.4 mmol/L (ref 3.5–5.3)
Sodium: 139 mmol/L (ref 135–146)

## 2015-08-25 LAB — TSH: TSH: 1.096 u[IU]/mL (ref 0.350–4.500)

## 2015-09-10 MED ORDER — DICYCLOMINE HCL 10 MG PO CAPS: ORAL_CAPSULE | ORAL | Status: DC

## 2016-02-22 ENCOUNTER — Ambulatory Visit: Payer: Self-pay | Admitting: Physician Assistant

## 2016-02-22 ENCOUNTER — Encounter: Payer: Self-pay | Admitting: Physician Assistant

## 2016-02-22 VITALS — BP 122/74 | HR 79 | Temp 97.7°F | Ht 61.0 in | Wt 125.6 lb

## 2016-02-22 DIAGNOSIS — Z1239 Encounter for other screening for malignant neoplasm of breast: Secondary | ICD-10-CM

## 2016-02-22 DIAGNOSIS — R0600 Dyspnea, unspecified: Secondary | ICD-10-CM

## 2016-02-22 NOTE — Progress Notes (Signed)
BP 122/74 mmHg  Pulse 79  Temp(Src) 97.7 F (36.5 C)  Ht 5\' 1"  (1.549 m)  Wt 125 lb 9.6 oz (56.972 kg)  BMI 23.74 kg/m2  SpO2 96%   Subjective:    Patient ID: Ariel Burns, female    DOB: 06-21-1961, 55 y.o.   MRN: [id number]  HPI: Ariel Burns is a 55 y.o. female presenting on 02/22/2016 for Follow-up and Breast Problem   HPI   Chief Complaint  Patient presents with  . Follow-up    pt states she feels SOB and chest tightness. pt states she quit working on november due to she thought it may be due to stress, but is stiil occuring  . Breast Problem    pt states she feels pain on L breast. feels as if it were swollen, but it isn't    Pt states only dyspnea on exertion, no sob other times  She is still seeing specialist [facility] MS   Relevant past medical, surgical, family and social history reviewed and updated as indicated. Interim medical history since our last visit reviewed. Allergies and medications reviewed and updated.   Current outpatient prescriptions:  .  BIOTIN PO, Take 1 capsule by mouth 3 (three) times daily., Disp: , Rfl:  .  celecoxib (CELEBREX) 200 MG capsule, Take 200 mg by mouth daily., Disp: , Rfl:  .  Cholecalciferol (VITAMIN D PO), Take 1 capsule by mouth daily., Disp: , Rfl:     Review of Systems  Constitutional: Positive for fatigue. Negative for fever, chills, diaphoresis, appetite change and unexpected weight change.  HENT: Positive for dental problem. Negative for congestion, drooling, ear pain, facial swelling, hearing loss, mouth sores, sneezing, sore throat, trouble swallowing and voice change.   Eyes: Positive for redness and itching. Negative for pain, discharge and visual disturbance.  Respiratory: Negative for cough, choking, shortness of breath and wheezing.   Cardiovascular: Negative for chest pain, palpitations and leg swelling.  Gastrointestinal: Positive for constipation. Negative for vomiting, abdominal pain,  diarrhea and blood in stool.  Endocrine: Positive for cold intolerance. Negative for heat intolerance and polydipsia.  Genitourinary: Negative for dysuria, hematuria and decreased urine volume.  Musculoskeletal: Positive for back pain, arthralgias and gait problem.  Skin: Negative for rash.  Allergic/Immunologic: Negative for environmental allergies.  Neurological: Negative for seizures, syncope, light-headedness and headaches.  Hematological: Negative for adenopathy.  Psychiatric/Behavioral: Positive for agitation. Negative for suicidal ideas and dysphoric mood. The patient is not nervous/anxious.     Per HPI unless specifically indicated above     Objective:    BP 122/74 mmHg  Pulse 79  Temp(Src) 97.7 F (36.5 C)  Ht 5\' 1"  (1.549 m)  Wt 125 lb 9.6 oz (56.972 kg)  BMI 23.74 kg/m2  SpO2 96%  Wt Readings from Last 3 Encounters:  02/22/16 125 lb 9.6 oz (56.972 kg)  08/24/15 124 lb (56.246 kg)  08/18/13 122 lb (55.339 kg)    Physical Exam  Constitutional: She is oriented to person, place, and time. She appears well-developed and well-nourished.  HENT:  Head: Normocephalic and atraumatic.  Neck: Neck supple.  Cardiovascular: Normal rate and regular rhythm.   Pulmonary/Chest: Effort normal and breath sounds normal.  Abdominal: Soft. Bowel sounds are normal. She exhibits no mass. There is no hepatosplenomegaly. There is no tenderness.  Musculoskeletal: She exhibits no edema.  Lymphadenopathy:    She has no cervical adenopathy.  Neurological: She is alert and oriented to person, place, and time.  Skin:  Skin is warm and dry.  Psychiatric: She has a normal mood and affect. Her behavior is normal.  Vitals reviewed.       Assessment & Plan:   Encounter Diagnoses  Name Primary?  Marland Kitchen Dyspnea Yes  . Screening for breast cancer     -Get mammogram -Get cxr for dyspnea -f/u 6 months.  RTO sooner prn

## 2016-02-23 ENCOUNTER — Ambulatory Visit (HOSPITAL_COMMUNITY)
Admission: RE | Admit: 2016-02-23 | Discharge: 2016-02-23 | Disposition: A | Discharge status: Home / Self Care | Payer: Self-pay | Source: Ambulatory Visit | Attending: Physician Assistant | Admitting: Physician Assistant

## 2016-02-23 DIAGNOSIS — R918 Other nonspecific abnormal finding of lung field: Secondary | ICD-10-CM | POA: Insufficient documentation

## 2016-02-23 DIAGNOSIS — R0600 Dyspnea, unspecified: Secondary | ICD-10-CM | POA: Insufficient documentation

## 2016-02-26 ENCOUNTER — Other Ambulatory Visit: Payer: Self-pay | Admitting: Physician Assistant

## 2016-02-27 ENCOUNTER — Other Ambulatory Visit: Payer: Self-pay | Admitting: Physician Assistant

## 2016-02-27 MED ORDER — ALBUTEROL SULFATE HFA 108 (90 BASE) MCG/ACT IN AERS: INHALATION_SPRAY | RESPIRATORY_TRACT | Status: AC

## 2016-03-06 ENCOUNTER — Ambulatory Visit (HOSPITAL_COMMUNITY)
Admission: RE | Admit: 2016-03-06 | Discharge: 2016-03-06 | Disposition: A | Discharge status: Home / Self Care | Payer: Self-pay | Source: Ambulatory Visit | Attending: Physician Assistant | Admitting: Physician Assistant

## 2016-03-06 ENCOUNTER — Other Ambulatory Visit: Payer: Self-pay | Admitting: Physician Assistant

## 2016-03-06 ENCOUNTER — Ambulatory Visit (HOSPITAL_COMMUNITY): Admission: RE | Admit: 2016-03-06 | Payer: Self-pay | Source: Ambulatory Visit

## 2016-03-06 ENCOUNTER — Other Ambulatory Visit (HOSPITAL_BASED_OUTPATIENT_CLINIC_OR_DEPARTMENT_OTHER): Payer: Self-pay

## 2016-03-06 DIAGNOSIS — Z1231 Encounter for screening mammogram for malignant neoplasm of breast: Secondary | ICD-10-CM

## 2016-03-19 IMAGING — MG MM DIGITAL DIAGNOSTIC UNILAT L
3 series · 3 of 3 positions shown · non-contrast
Comparison: 11/17/2014 and prior mammograms dating back to
07/21/2002

CLINICAL DATA: 53-year-old female with possible mass in the outer
left breast on screening mammogram.

EXAM:
DIGITAL DIAGNOSTIC LEFT MAMMOGRAM WITH CAD

[L CC]
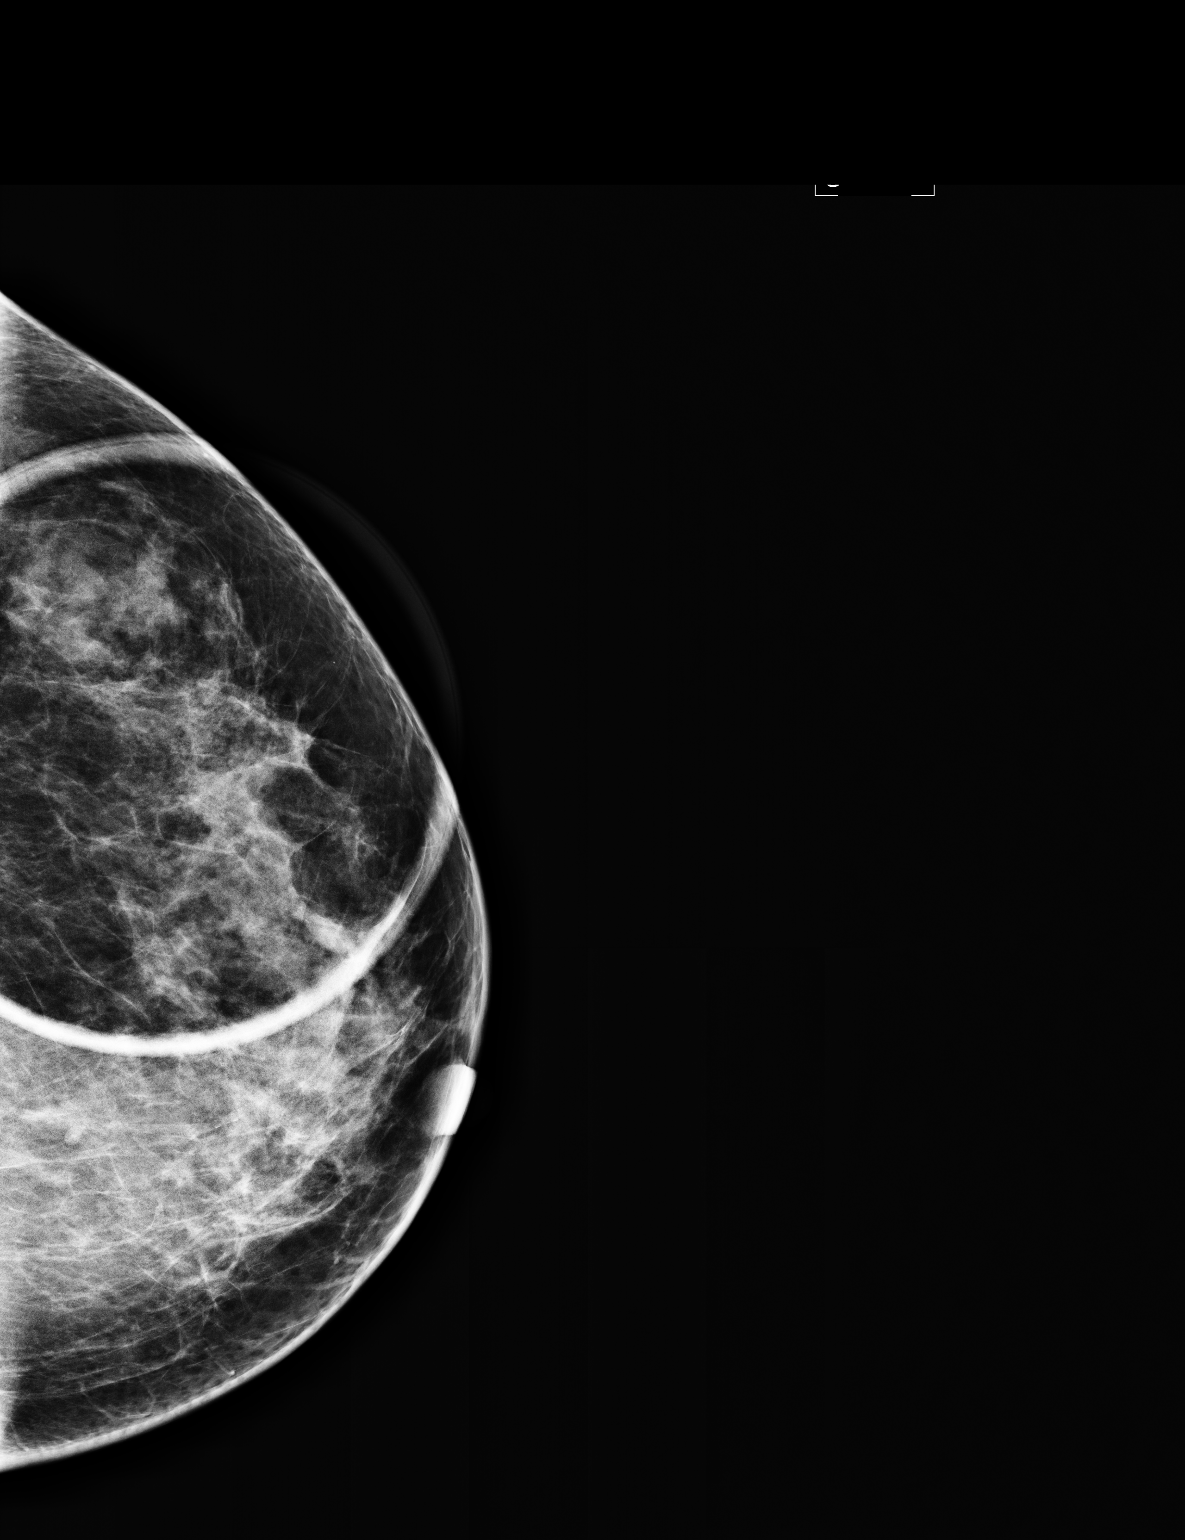

[L ML (1 of 2)]
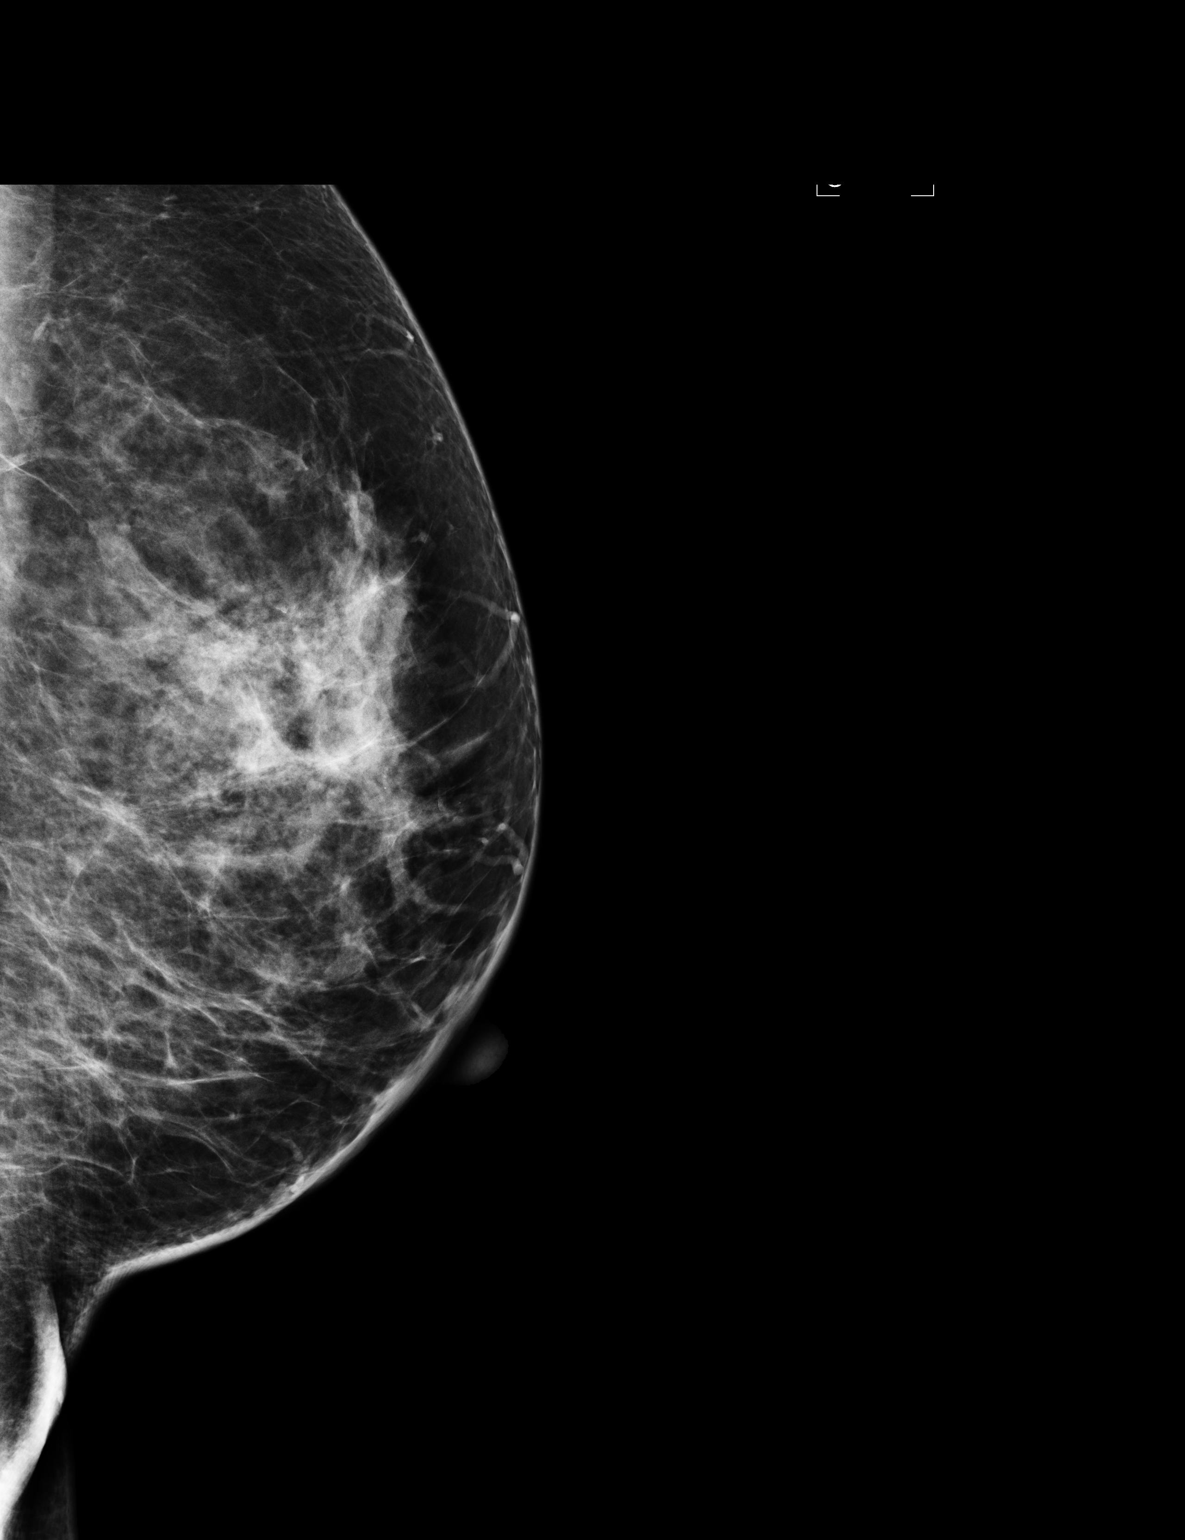

[L ML (2 of 2)]
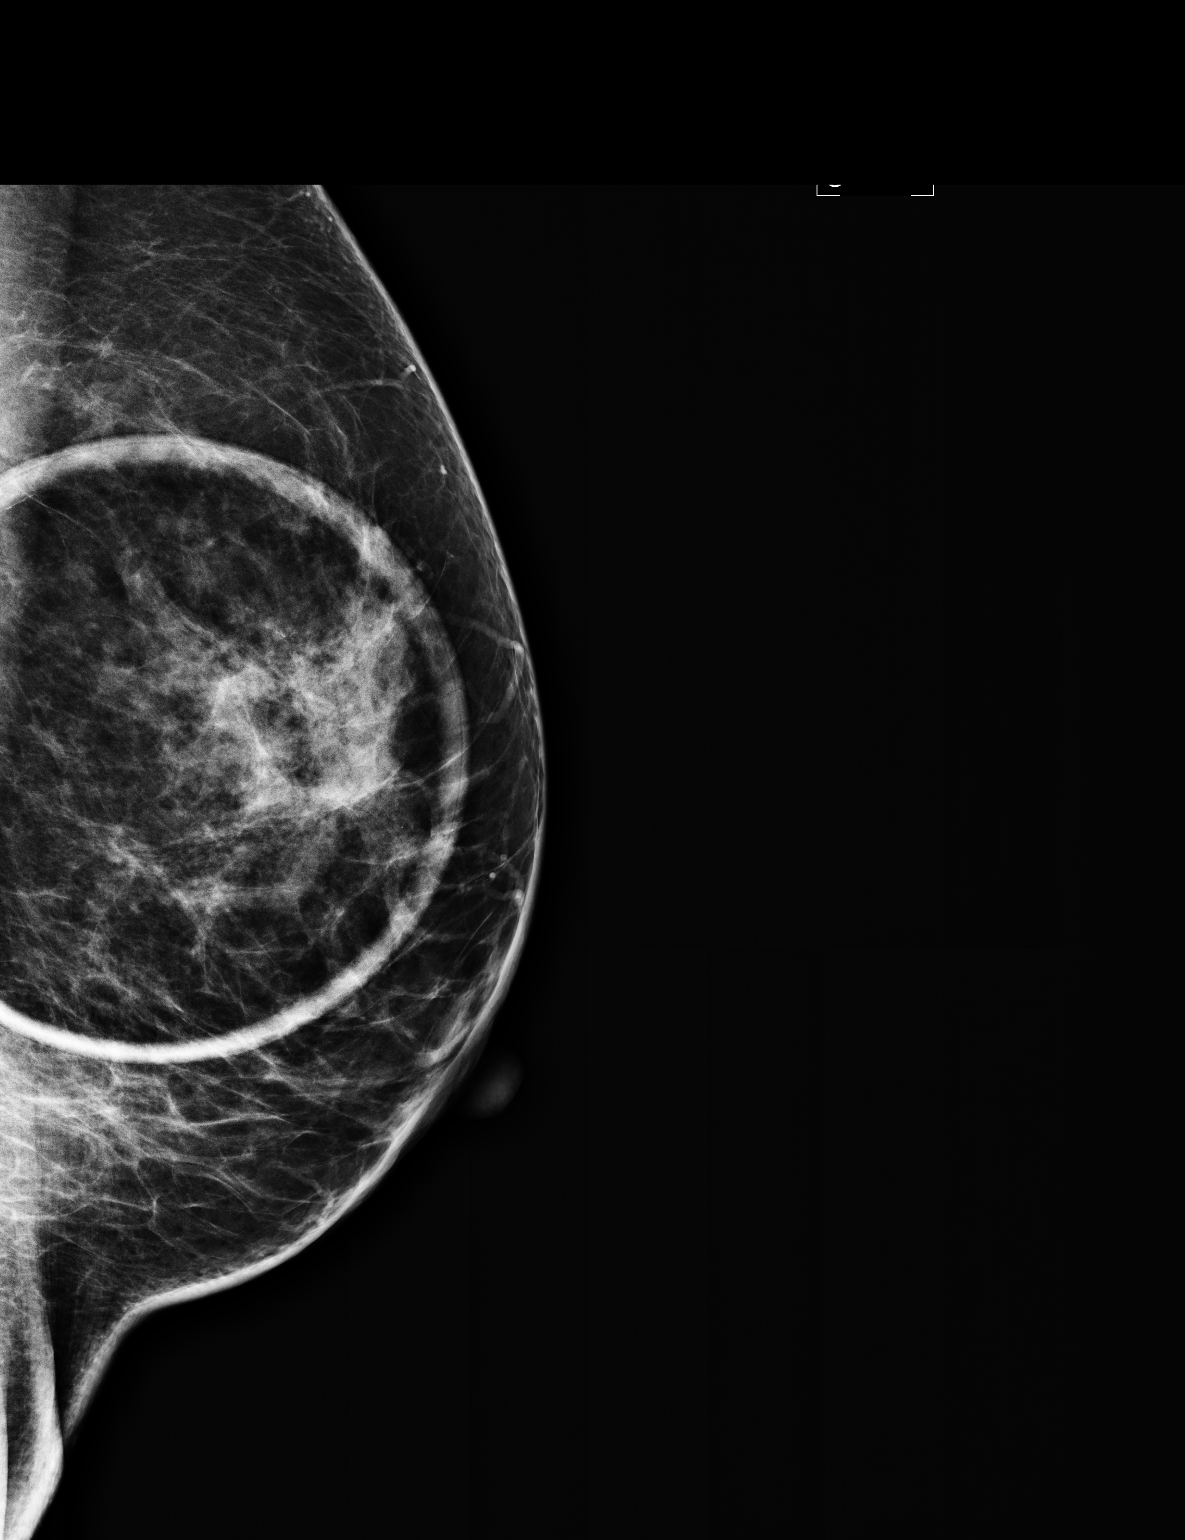

[3 of 3 positions shown; findings below may reference images not displayed]

ACR Breast Density Category b: There are scattered areas of
fibroglandular density.
FINDINGS: Spot compression and ML views of the left breast demonstrate
effacement of the screening density within the outer left breast.
This area now has a similar appearance to remote studies. No
persistent mass, distortion or worrisome calcifications are noted.

Mammographic images were processed with CAD.
IMPRESSION: No persistent abnormality in the area of the screening study
finding.

RECOMMENDATION:
Bilateral screening mammograms in 1 year.

I have discussed the findings and recommendations with the patient.
Results were also provided in writing at the conclusion of the
visit. If applicable, a reminder letter will be sent to the patient
regarding the next appointment.

BI-RADS CATEGORY  1: Negative

## 2016-07-25 ENCOUNTER — Emergency Department (HOSPITAL_BASED_OUTPATIENT_CLINIC_OR_DEPARTMENT_OTHER)
Admission: EM | Admit: 2016-07-25 | Discharge: 2016-07-26 | Disposition: A | Discharge status: Home / Self Care | Payer: Self-pay | Attending: Emergency Medicine | Admitting: Emergency Medicine

## 2016-07-25 DIAGNOSIS — Z791 Long term (current) use of non-steroidal anti-inflammatories (NSAID): Secondary | ICD-10-CM | POA: Insufficient documentation

## 2016-07-25 DIAGNOSIS — R42 Dizziness and giddiness: Secondary | ICD-10-CM

## 2016-07-25 DIAGNOSIS — R110 Nausea: Secondary | ICD-10-CM

## 2016-07-25 LAB — COMPREHENSIVE METABOLIC PANEL
ALT (GPT): 18 U/L (ref 7–33)
AST (GOT): 16 U/L (ref 9–38)
Albumin: 4.4 g/dL (ref 3.5–5.2)
Alkaline Phosphatase (Total): 70 U/L (ref 31–132)
Anion Gap: 7 (ref 4–12)
Bilirubin (Total): 0.3 mg/dL (ref 0.2–1.3)
Calcium: 9.5 mg/dL (ref 8.9–10.2)
Carbon Dioxide, Total: 28 meq/L (ref 22–32)
Chloride: 104 meq/L (ref 98–108)
Creatinine: 0.55 mg/dL (ref 0.38–1.02)
GFR, Calc, African American: >60 mL/min/{1.73_m2} (ref >59)
GFR, Calc, European American: >60 mL/min/{1.73_m2} (ref >59)
Glucose: 99 mg/dL (ref 62–125)
Potassium: 3.8 meq/L (ref 3.6–5.2)
Protein (Total): 7.4 g/dL (ref 6.0–8.2)
Sodium: 139 meq/L (ref 135–145)
Urea Nitrogen: 11 mg/dL (ref 8–21)

## 2016-07-25 LAB — CBC, DIFF
% Basophils: 0 %
% Eosinophils: 4 %
% Immature Granulocytes: 0 %
% Lymphocytes: 56 %
% Monocytes: 7 %
% Neutrophils: 33 %
Absolute Eosinophil Count: 0.17 10*3/uL (ref 0.00–0.50)
Absolute Lymphocyte Count: 2.71 10*3/uL (ref 1.00–4.80)
Basophils: 0.01 10*3/uL (ref 0.00–0.20)
Hematocrit: 40 % (ref 36–45)
Hemoglobin: 13.4 g/dL (ref 11.5–15.5)
Immature Granulocytes: 0 10*3/uL (ref 0.00–0.05)
MCH: 30 pg (ref 27.3–33.6)
MCHC: 33.5 g/dL (ref 32.2–36.5)
MCV: 90 fL (ref 81–98)
Monocytes: 0.32 10*3/uL (ref 0.00–0.80)
Neutrophils: 1.55 10*3/uL — ABNORMAL LOW (ref 1.80–7.00)
Platelet Count: 282 10*3/uL (ref 150–400)
RBC: 4.47 10*6/uL (ref 3.80–5.00)
RDW-CV: 12.8 % (ref 11.6–14.4)
WBC: 4.76 10*3/uL (ref 4.30–10.00)

## 2016-08-22 ENCOUNTER — Ambulatory Visit: Payer: Self-pay | Admitting: Physician Assistant

## 2016-09-02 ENCOUNTER — Encounter: Payer: Self-pay | Admitting: Physician Assistant

## 2016-09-03 NOTE — Progress Notes (Signed)
MULTIPLE SCLEROSIS CENTER NEW PATIENT NOTE       Referral: 55 year old woman who has been kindly referred by Dr Janeece Fitting for evaluation and treatment of previously diagnosed multiple sclerosis.       Chief Complaint: Multiple Sclerosis      History of Present Illness:  First visit with me. Spanish speaking. Seen with in person interpreter. Difficult to obtain history (seems to be not just due to language limitations).     2004: bad headaches back of head, balance problems, difficulty holding up head  2005: vision problems (double vision in distance, next to each other "like thicker")  Saw a neurologist in [place] for this and was told she has MS    2004-2013 Betaseron (Dr Tinnie Gens), then her doctor moved away and new MD (Dr. Morton Peters Zeid) stopped Betaseron  2013 Infusion once a day x 3 days patient does not know what it was called, was planned to do every 6 months but she lost insurance, so she couldn't do it again - helped her a lot, all symptoms got a lot better    2015 episode for 1 month problems with balance and palpitations and headaches. Lost control over everything: speech, hearing, seeing, unable to get up when falling down.    Relocated to [place] a few months ago to reduce stress (fear of ICE when living in [place]).   Seen at [facility] ER with vertigo (not new symptom for her), referral given to see me. Still gets vertigo (room spinning) 3 times a day. Took meclizine which was very sedating (not sure if it helped).   Also still has balance problems, independent of vertigo.     Still has headaches with sensitivity to light. No nausea. Gets headaches about every 2 weeks.   Most medications she was given before for headaches made it worse (Tramadol for example). Celebrex helps a little. She takes it every night. Does not recall names Nortriptyline, Amitriptyline. Has taken Gabapentin before and it made her pain worse. Topiramate made her "almost die", could not get up the next day.     Also has  pain in her muscles, was told she had fibromyalgia. Whole body hurts, esp lower back and shoulders and feet.     Negative LP and VEP in the past per outside records. No prior MRI images available. MRI brain with "white matter lesions", MRI C spine normal per report.     ROS:   Fatigue: Quite a bit  Sleep: A little bit  Double vision: Quite a bit  Blurry vision: Quite a bit  Swallowing problems: Not at all  Dizziness / light headedness: Quite a bit  Numbness or tingling or odd sensations: A little bit  Pain: Quite a bit  Weakness: Quite a bit  Falling: A little bit  Spasms or jerking: Not at all  Tightness or stiffness: Not at all  Bladder problems: Quite a bit  Bowel problems: Not at all  Sexual problems: Not at all  Depression: Not at all  Anxiety: A little bit  Problems thinking: Quite a bit  Heat sensitivity: Quite a bit  Skin problems (e.g., injection site reaction): Quite a bit  Heart palpitations: Quite a bit  Shortness of breath: Quite a bit    Data:  MRI brain 02/15: Stable white matter lesions periventricular and pericallosal.    MRI C and T spine 04/15: No spinal cord lesions. Hyperintense lesion lower pole right kidney. C4-5 disc protrusion, effaces ventral thecal  sac but does not deform the cord      Past Medical History:  has a past medical history of Headache and MS (multiple sclerosis) (HCC).    Medications:   Current Outpatient Prescriptions   Medication Sig Dispense Refill   . Celecoxib (CELEBREX OR)      . Cyanocobalamin (VITAMIN B-12 OR)        No current facility-administered medications for this visit.        Family History: family history is negative for Multiple Sclerosis.    Social History:   Social History     Social History Narrative    Spanish speaking. 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 Moved from [place] to [place] with her daughter.        Physical Exam:   BP 128/51   Pulse 73   Temp 97.7 F (36.5 C) (Temporal)   Ht 5\' 3"  (1.6 m)       General: in no acute distress   Eyes: no scleral icterus or pallor    Respiratory: breathing comfortably on room air    Cardiovascular: no lower extremity edema     Neuro Examination     Mental Status: alert & oriented, during today's encounter normal attention, concentration, language, calm and appropriate in contact and mood balanced   Cranial Nerves: appropriate pupillary responses, appropriate visual fields, EOMI, no nystagmus, no dysconjugate gaze, no INO, face symmetrical, no facial paresis, facial sensation decreased left face to light touch and cold, hearing intact to finger rub, speech fluent, no dysarthria, palate elevates symmetrically, sternocleidomastoids strong and tongue midline without fasciculations   Motor:    Tone: normal.     Atrophy/Fasiculations: not observed     Pronator Drift: none.     Strength: 5/5 in all muscle groups of the upper and lower extremities   Reflexes: present and symmetrical throughout   Coordination: finger to nose with no dysmetria, fine finger movements and foot tapping intact   Gait and Balance: gait within exam room intact, can walk on heels and toes; tandem walk and Romberg mildly unsteady  Sensory: decreased sensation left arm and leg to vibration, light touch and pin prick    Assesment and Plan:   [name] is a 55 year old woman with prior diagnosis of multiple sclerosis, currently on no DMT, presenting to establish care.     1. Diagnosis: History obtained from patient is somewhat limited but it seems like she may have had episodes of neurological symptoms compatible with MS in the past. It is unclear to me how much her neurological symptoms have been accompanied by headaches as complicated migraines are a differential consideration. Overall, updated imaging is needed for me to better evaluate whether she may have MS.   - MRI brain, C and T spine w/wo Gd at [facility]. Patient denies need for sedation.     2. Disease modifying therapy: As no imaging  is available for me to review today, I would like to hold off on starting a DMT until diagnosis is more clear. She overall tolerated Betaseron well, unclear if she had relapses or new MRI findings while on Betaseron.   - Will try to obtain additional records from [place]   - Risk mitigating labs incl JCV) sent today  - Plan to send Vitamin D at next visit    3. Symptom management:  - Vertigo: Referral to Physical Therapy at [facility] for balance training  and vestibular therapy  - Bladder problems: Plan bladder scan at next visit  - Fatigue: Will send TSH and CBC. Consider trial of Amandine.     Return to see me after/same day as MRI    Histories, medications and problem list have been reviewed and updated as appropriate.  More than 50% of this encounter was spent in counseling and coordination of patient care with plan outlined above. Face-to-face time with this patient was 90 minutes. The visit lasted from 15:45 to 17:15.

## 2016-09-06 ENCOUNTER — Ambulatory Visit: Payer: Self-pay | Attending: Neurology

## 2016-09-06 ENCOUNTER — Ambulatory Visit: Payer: Self-pay | Attending: Neurology | Admitting: Neurology

## 2016-09-06 VITALS — BP 128/51 | HR 73 | Temp 97.7°F | Ht 63.0 in

## 2016-09-06 DIAGNOSIS — R42 Dizziness and giddiness: Secondary | ICD-10-CM

## 2016-09-06 DIAGNOSIS — G35 Multiple sclerosis: Secondary | ICD-10-CM

## 2016-09-06 DIAGNOSIS — G43709 Chronic migraine without aura, not intractable, without status migrainosus: Secondary | ICD-10-CM

## 2016-09-06 LAB — COMPREHENSIVE METABOLIC PANEL
ALT (GPT): 15 U/L (ref 7–33)
AST (GOT): 18 U/L (ref 9–38)
Albumin: 4.5 g/dL (ref 3.5–5.2)
Alkaline Phosphatase (Total): 115 U/L (ref 31–132)
Anion Gap: 5 (ref 4–12)
Bilirubin (Total): 0.3 mg/dL (ref 0.2–1.3)
Calcium: 9.1 mg/dL (ref 8.9–10.2)
Carbon Dioxide, Total: 30 meq/L (ref 22–32)
Chloride: 104 meq/L (ref 98–108)
Creatinine: 0.6 mg/dL (ref 0.38–1.02)
GFR, Calc, African American: >60 mL/min/{1.73_m2} (ref >59)
GFR, Calc, European American: >60 mL/min/{1.73_m2} (ref >59)
Glucose: 93 mg/dL (ref 62–125)
Potassium: 4.3 meq/L (ref 3.6–5.2)
Protein (Total): 7.2 g/dL (ref 6.0–8.2)
Sodium: 139 meq/L (ref 135–145)
Urea Nitrogen: 11 mg/dL (ref 8–21)

## 2016-09-06 NOTE — Progress Notes (Signed)
Site: right WRIST  What was drawn: 3 SST, 2 PEARL, 1 LAVENDER  Number of attempts: 1  Adverse reaction(s): NONE  Drawn by Mamta K. MA

## 2016-09-06 NOTE — Patient Instructions (Addendum)
-   MRI brain and spine at [facility]: [phone]  - Blood draw today  - Physical Therapy at [facility]: [phone]  - Return to see me after/same day as MRI

## 2016-09-07 LAB — HIV ANTIGEN AND ANTIBODY SCRN
HIV Antigen and Antibody Interpretation: NONREACTIVE
HIV Antigen and Antibody Result: NONREACTIVE

## 2016-09-07 LAB — ANA REFLEX COMPREHENSIVE PANEL
Ana Interpretation Comment 1: POSITIVE
Ana Screen By Multiplex: POSITIVE — AB
Antibodies to Nuclear Ags by IF (ANA): NEGATIVE

## 2016-09-09 LAB — ANA REFLEX ID PANEL BTY
Anti Centromere B: NEGATIVE
Anti Chromatin: NEGATIVE
Anti Jo1: NEGATIVE
Anti RNP: NEGATIVE
Anti Ribosomal P: NEGATIVE
Anti SSA/Ro: POSITIVE — AB
Anti SSB/La: NEGATIVE
Anti Scl 70 Antibody: NEGATIVE
Anti Sm/RNP: NEGATIVE
Anti Sm: NEGATIVE
Anti dsDNA (EIA): 0 U/mL (ref 0–14)

## 2016-09-09 LAB — VZV IMMUNE STATUS BY IFA: Varicella Zoster Immune Status Result: >1:8 {titer}

## 2016-09-09 LAB — HEPATITIS B BATTERY (HBSAG W/RFLX PCR, ANTI-HBS, ANIT-HBC)
Hepatitis B Core Ab: NONREACTIVE
Hepatitis B Surf Antibody Intl Units: <8 [IU]
Hepatitis B Surface Ab: NONREACTIVE
Hepatitis B Surface Antigen w/Reflex: NONREACTIVE

## 2016-09-09 LAB — HEPATITIS C AB WITH REFLEX PCR: Hepatitis C Antibody w/Rflx PCR: NONREACTIVE

## 2016-09-18 ENCOUNTER — Encounter (HOSPITAL_BASED_OUTPATIENT_CLINIC_OR_DEPARTMENT_OTHER): Payer: Self-pay | Admitting: Neurology

## 2016-09-18 DIAGNOSIS — G43709 Chronic migraine without aura, not intractable, without status migrainosus: Secondary | ICD-10-CM | POA: Insufficient documentation

## 2016-09-18 DIAGNOSIS — R42 Dizziness and giddiness: Secondary | ICD-10-CM | POA: Insufficient documentation

## 2016-09-18 DIAGNOSIS — G35 Multiple sclerosis: Secondary | ICD-10-CM | POA: Insufficient documentation

## 2016-09-26 LAB — JCV STRATIFY ANTIBODY W/REFLEX (SENDOUT): JCV Stratify Antibody Result: POSITIVE

## 2016-10-03 ENCOUNTER — Ambulatory Visit (HOSPITAL_BASED_OUTPATIENT_CLINIC_OR_DEPARTMENT_OTHER): Payer: Self-pay | Attending: Neurology | Admitting: Rehabilitative and Restorative Service Providers"

## 2016-10-03 DIAGNOSIS — Z7409 Other reduced mobility: Secondary | ICD-10-CM

## 2016-10-03 DIAGNOSIS — G35 Multiple sclerosis: Secondary | ICD-10-CM

## 2016-10-03 DIAGNOSIS — R42 Dizziness and giddiness: Secondary | ICD-10-CM

## 2016-10-03 NOTE — Progress Notes (Signed)
[facility] PHYSICAL THERAPY INITIAL PLAN OF CARE          L & I Claim #: N/A    Certification From: 10/03/16  Certification To: 11/03/16  ___ Discontinue Therapy Services    Date of Symptom Onset : 07/21/16  Start of Care Date: 10/03/16  VISITS FROM SOC: 1  TOTAL [facility] UNITS TO DATE: 0    Referring Provider: Harmon Dun MD  Interpreter Status: Yes - In Person    Reason for Referral: vestibular  History of Present Illness:  55 year old woman with MS and vertigo, still undergoing medical work-up.     Pertinent Medical/Surgical History:   Past Medical History:   Diagnosis Date   . Headache    . MS (multiple sclerosis) (HCC)      Social History: Pt lives in a basement apartment in a house with her daughter. 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 She says that a year ago her doctor told her that she should stop working because she was having cardiac and respiratory problems. She was working at [employer]. She describes it as very stressful. She enjoys being outside and breathing fresh air.  Current / Past Rehabilitation: no prior PT  Prior Level of Function  Prior Function Comments: Independent     Precautions:    Precautions: none                      Fall Screening:  Are you afraid of falling?: Yes  Have you fallen in the past year?: Yes  Issues with walking/balance/feeling unsteady: Yes      SUBJECTIVE:  Patient's Statement: Pt states that about 1.5 years ago she started to feel a little dizzy and tired but has felt worse since October of this year. At its worst she was unable to get out of bed because the room was spinning so much. She went to the ED that day and was given medication. She takes Celebrex for pain and it is helping a lot. She was diagnosed with MS in 2004. In September she was squatting to clean the bathroom tub and she fell backwards. She rates her dizziness today as 5/10.  Pain Score: 0 - No pain    Subjective Vestibular questions:      Marland Kitchen Vertigo:    o Yes  o Length of episodes / frequency: "flash" of less than 1 second 4-5 times per day  o Cause / Induced by: no pattern identified  . Disequilibrium:   o Yes  o Frequency: variable         o Caused / Induced by: climbing something, walking through narrow areas, fast head movements  o   Patient Stated Goals: To feel more secure and more calm, increase endurance    OBJECTIVE:  BP in sitting: 136/53  Heart rate72    MUSCULOSKELETAL  Cervical ROM: full ROM (pt reports feeling weakness at her head/neck but no real symptoms of dizziness)  Strength: LUE grossly 4/5 at shoulder and elbow, 5/5 at wrist; RUE 5/5 at shoulder, elbow and wrist.  LLE hip flexion 4/5, knee flexion/extension 5/5, ankle DF 5/5, ankle PF >2+/5  RLE 5/5 throughout    CERVICAL SCREEN   Vertebral Artery Testing: negative    COORDINATION:  UE/LE Rapid alternating movement (RAM): in tact bilaterally  UE Finger to nose (FTN): mild dysmetria with left UE    OCULOMOTOR  EXAM  Spontaneous Nystagmus:  negative  Gaze Holding Nystagmus:  Mild at left side  Ocular ROM all planes:  Full ROM  Vergence:  5" with pt able to converge eyes  Smooth Pursuit:    Mild saccadic movement at left end range  Saccadic Eye movments: in tact in all directions, possible correction with leftward eye movements and pt reported feeling "bad"  VORc: deferred 2/2 pt feeling too dizzy              VOR slow: saccadic eye movement with leftward gaze        VOR RAPID HT:   Corrective saccade in both left and right directions           [employer]: not assessed as pt doesn't report any dizziness with laying down and is very motion sensitive to quick movements         BALANCE TESTS:  Romberg:   EO  normal EC   Increased posterior sway  Tandem Stance:  EO      normal EC   Able to maintain 15 seconds before lateral LOB      GAIT:   Gait description:     No apparent deviations noted    INTERVENTION:  Evaluation Code 44034 x 45 minutes    Intervention 1- Neuromuscular re-ed x 5  min:   Pt given x1 gaze stabilization exercises and advised to perform for 60 seconds with lateral and then vertical head turns for 5 repetitions in each direction. Pt did not perform today because she needed to drive home and was worried about increasing her dizziness but she did verbalize understanding.    Education:    Primary learner: patient  Preferred learning style:  demonstration, written, verbal and interpreter needed  Barriers to learning: none   Cultural practices that influence care: none   Topic taught: Role of PT, plan of care, vestibular rehab and HEP introduction with gaze stabilization   Response to education: pt verbalized understanding       ASSESSMENT: Pt presents with the following:  Comorbidities and Personal Factors affecting plan of care: ongoing medical work-up  Impairments: impaired strength, feelings of vertigo and dysequilibrium, impaired activity tolerance  Activity Limitations: fall risk, limited in household and community mobility  Participation Restrictions: limited in role as mother and grandmother, unable to work  Clinical Presentation for Selection of Evaluation Code: Unstable  Clinical Decision Making Complexity:High  Pt will benefit from outpatient PT to address these impairments and activity limitations to establish HEP for a left peripheral hypofunction as well as motion sensitivity; improve strength and activity tolerance. Her exam today was most consistent with a left peripheral hypofunction as well as motion sensitivity. She also demonstrates left hemibody weakness.     Fall risk based on assessment: moderate     Rehabilitation potential is: Good    Potential Barriers to achieve rehab goals: chronicity of impairment    Care Connections Functional Index Score:  not assessed today 2/2 pt late and interpreter not available beyond appointment time; will be assessed at next session    GOALS:    Discharge Goals:  1. Pt will consistently perform comprehensive HEP for vestibular  rehab as well as strength and balance  2. Pt will report dizziness no worse than 3/10 throughout her day  3. Pt will increase 6 minute walk test distance to age-predicted normal to indicate improved activity tolerance  4. FGA goal to be assessed at next session  Current Level of Function Monthly Goals     10/03/2016 To be reviewed by:  11/03/2016   FGA not assessed   Therapist will assess FGA to determine fall risk   Pt not performing HEP Pt will perform HEP for peripheral hypofunction as well as motion sensitivity   DHI not assessed Therapist will assess DHI to determine severity of impairment               The above goals and plan of care have been discussed and agreed upon by the patient.    PLAN:   Patient will be seen for 1x/week for approximately 6 total visits.      Planned interventions: Therapeutic Activities, Therapeutic Exercise, Neuromuscular Re-education, Gait Training, Canalith Repositioning Maneuver    Plan for next visit:  Fill out CareConnections, assess FGA, assess response to gaze stabilization exercises, progress HEP for vestibular rehab and include strength and balance training    Anticipated discharge date: February 2018    Donnamarie RossettiAndrea Kimalat PT DPT NCS  Physical Therapist  [facility]  [facility]

## 2016-10-10 ENCOUNTER — Ambulatory Visit (HOSPITAL_BASED_OUTPATIENT_CLINIC_OR_DEPARTMENT_OTHER): Payer: Self-pay | Admitting: Rehabilitative and Restorative Service Providers"

## 2016-10-10 DIAGNOSIS — G35 Multiple sclerosis: Secondary | ICD-10-CM

## 2016-10-10 DIAGNOSIS — Z7409 Other reduced mobility: Secondary | ICD-10-CM

## 2016-10-10 DIAGNOSIS — R42 Dizziness and giddiness: Secondary | ICD-10-CM

## 2016-10-10 NOTE — Progress Notes (Signed)
[facility] PHYSICAL THERAPY TREATMENT NOTE  L&I Claim Number:  N/A    Certification From: 10/03/16  Certification To: 11/03/16    [facility]  Referring Provider: Harmon DunGloria von Geldern MD    Interpreter Status: Yes - In Person    SUBJECTIVE: Pt states that she had 4-5 flashes of vertigo that lasted a few seconds and she had to go to bed early because of it. It was unusual for her to have multiple episodes at once. She felt weak afterwards because of it. Today she feels that she is weak in her brain. She states she has been doing her x1 exercises and now feels that she can turn her head without experiencing vertigo.     OBJECTIVE/INTERVENTIONS:    Intervention 1- Neruomuscular re-ed x 30 Minutes:   Functional Gait Assessment: 16/30   Gait Level surface: 2   Change in gait speed: 3   Gait with horizontal head turns: 1   Gait with vertical head turns: 1   Gait and pivot turn: 2   Step over obstacle: 3   Gait with narrow base of support: 1   Gait with eyes closed: 0 (significant leftward drift)   Ambulating backwards: 1   Steps: 2  FGA cutoff score of 22/30 is effective in classifying fall risk in older adults and predicting unexplained falls in community-dwelling older adults.    x1 gaze stabilization exercises progressed to standing, then standing with feet together. Pt able to demonstrate this for 60 seconds each with horizontal and vertical head turns. She reported feeling "weak in the head. BP measured in sitting, 137/59. Pt states that this is her BP had always been 115 for the systolic but lately has been higher. Her PCP is aware. Pt instructed to progress HEP for gaze stabilization to do with feet together and to use chairs on either side of her for safety but not to hold onto the chairs.    Edu to pt for balance being composed of vision, vestibular and proprioception. Pt verbalized understanding but had some follow-up questions later.    Intervention 2- Therapeutic exercises x 10 Minutes:   HEP issued for  strengthening: quadroped hip extension, sit<>stand and each performed x10.Handout provided in Spanish.    Non-billable Time x 5 minutes:   CareConnections filled out with in-person interpreter, pt scored 43/50.    Education:    Primary learner: patient   Preferred learning style:  demonstration, written, verbal and interpreter needed  Barriers to learning: none   Cultural practices that influence care: none   Topic taught: balance re-ed, HEP progression, vestibular edu   Response to education: pt verbalized understanding      ASSESSMENT/PATIENT RESPONSE TO THERAPY:  Pt with improvement in motion sensitivity following 1 week of completing her x1 gaze stabilization exercises. Her FGA score indicates a fall risk, however, and she specifically was challenged by low vision tasks, including walking with eyes closed and walking backwards.    PLAN:   Continue intervention per Plan of Care.     Plan for next visit: Progress to x2 gaze stabilization exercises, include balance exercises with feet together and eyes closed and feet tandem with eyes closed.    Donnamarie RossettiAndrea Kimalat PT DPT NCS  Physical Therapist  [facility]  [facility]

## 2016-10-15 ENCOUNTER — Ambulatory Visit (HOSPITAL_BASED_OUTPATIENT_CLINIC_OR_DEPARTMENT_OTHER): Payer: Self-pay | Admitting: Rehabilitative and Restorative Service Providers"

## 2016-10-15 DIAGNOSIS — Z7409 Other reduced mobility: Secondary | ICD-10-CM

## 2016-10-15 DIAGNOSIS — R42 Dizziness and giddiness: Secondary | ICD-10-CM

## 2016-10-15 DIAGNOSIS — G35 Multiple sclerosis: Secondary | ICD-10-CM

## 2016-10-15 NOTE — Progress Notes (Signed)
[facility] PHYSICAL THERAPY TREATMENT NOTE  L&I Claim Number:  N/A    Certification From: 10/03/16  Certification To: 11/03/16    [facility]  Referring Provider: Harmon DunGloria von Geldern MD    Interpreter Status: Yes - Telephone    SUBJECTIVE: Pt states that she feels her MS symptoms are getting worse since starting PT. She feels weak in her head but also notes feeling more flexible and stronger in her neck. She rates her symptoms of multiple sclerosis as a 7/10 that is the weakness in her head. Overall, however, she does feel that she is much better in terms of her strength and balance.    OBJECTIVE/INTERVENTIONS:    Intervention 1- Neuromuscular re-ed x 45 Minutes:   Balance re-ed feet together eyes closed 30 second hold, feet in tandem with eyes closed with 30 second hold, and single leg stance with 10 second hold with eyes closed. Pt given this as HEP and demonstrated proficiency.  Sitting on Swiss ball with eyes closed and hands initially on knees, then progressed to clasped in front and finally with slow marching. This to challenge pt's proprioception and vestibular inputs for balance. Then with eyes closed and both feet on the floor, pt performed trunk rotations.  X1 gaze stabilization exercises performed on 4" foam with feet hip width apart with cues to move head continuously. Then performed with feet together; pt required handheld A and reported a flash of dizziness. BP 114/53 heart rate 71. Pt able to resume. Each gaze stabilization exercise performed for 60 seconds.    Education:    Primary learner: patient  Preferred learning style:  demonstration, written, verbal and interpreter needed  Barriers to learning: none   Cultural practices that influence care: none   Topic taught: HEP progression, balance re-ed   Response to education: pt demonstrated teach-back of HEP and verbalized understanding      ASSESSMENT/PATIENT RESPONSE TO THERAPY:  Pt is very motivated to improve and has shown good response to the  gaze stabilization adaptation exercises. She has been compliant with her HEP and, although she reports worsening of her MS symptoms, she also seems to be moving faster and reports feeling stronger.    PLAN:   Continue intervention per Plan of Care.     Plan for next visit: assess 6 minute walk test, introduce isometric neck strengthening exercises    Donnamarie RossettiAndrea Kimalat PT DPT NCS  Physical Therapist  [facility]  [facility]

## 2016-10-25 ENCOUNTER — Encounter (HOSPITAL_BASED_OUTPATIENT_CLINIC_OR_DEPARTMENT_OTHER): Payer: Self-pay | Admitting: Rehabilitative and Restorative Service Providers"

## 2016-10-28 NOTE — Progress Notes (Deleted)
[facility] PHYSICAL THERAPY TREATMENT NOTE  L&I Claim Number:  N/A            Visits from [facility]: 4  Referring Provider: Harmon Dun MD         SUBJECTIVE: ***    OBJECTIVE/INTERVENTIONS:    Intervention 1- *** x *** Minutes: ***  6 minute walk test assessed ***    Intervention 2- *** x *** Minutes: ***  Isometric neck strengthening exercises introduced ***    Intervention 3- *** x *** Minutes: ***    Non-billable Time x ***minutes: ***    Education:    Primary learner: patient ***  Preferred learning style:  {PREFERRED LEARNING UJWJX:914782}  Barriers to learning: {BARRIERS TO LEARNING:105814::"none"}   Cultural practices that influence care: {CULTURAL PRACTICES INFLUENCING  CARE:105816::"none"}   Topic taught: ***   Response to education: ***      ASSESSMENT/PATIENT RESPONSE TO THERAPY:  ***    PLAN:   Continue intervention per Plan of Care.     Plan for next visit: ***    Onalee Hua, PT

## 2016-10-29 ENCOUNTER — Encounter (HOSPITAL_BASED_OUTPATIENT_CLINIC_OR_DEPARTMENT_OTHER): Payer: Self-pay | Admitting: Rehabilitative and Restorative Service Providers"

## 2016-11-01 ENCOUNTER — Ambulatory Visit (HOSPITAL_BASED_OUTPATIENT_CLINIC_OR_DEPARTMENT_OTHER): Payer: Self-pay | Attending: Neurology | Admitting: Rehabilitative and Restorative Service Providers"

## 2016-11-01 DIAGNOSIS — G35 Multiple sclerosis: Secondary | ICD-10-CM

## 2016-11-01 DIAGNOSIS — Z7409 Other reduced mobility: Secondary | ICD-10-CM

## 2016-11-01 NOTE — Progress Notes (Signed)
[facility] PHYSICAL THERAPY TREATMENT NOTE  L&I Claim Number:  N/A    Certification From: 10/03/16  Certification To: 11/03/16    Vis[facility] from [facility]: 4  Referring Provider: Harmon Dun MD    Interpreter Status: Yes - Telephone    SUBJECTIVE: Pt states that she missed her last appointment because her left leg was hurting, starting last week. She states that it's a combination of pain and weakness and rates the pain as a 6/10. She states that the pain is intense and feels that it's in the muscle. She has an ankle brace that she uses and states that her leg feels better with tennis shoes compared to no shoes or her sandals that she wears inside. She feels that the dizziness has improved significantly but the day before yesterday she had 3 flashes and yesterday she had 2. The flashes have been less intense but she does feel like she'll fall backwards.     OBJECTIVE/INTERVENTIONS:    Intervention 1- Gait re-ed x 10 Minutes:   assessed, pt ambulated 1490' (459m) without device.  Normative Data for 6 Minute Walk Test for   Community-dwelling Elderly:   Kandice Robinsons et al, 2002; n = 96; community dwelling elderly people with independent function who were nonsmokers with no history of dizziness; > 18 yo and did not use assistive devices, Community-dwelling Elderly)   Mean Distance in Meters by Age & Gender    Age  Female  Female    28-69 yrs  572 m  538 m    54-79 yrs  527 m  471 m    80-89 yrs  417 m  105 m        Intervention 2- Therapeutic exercises x 35 Minutes:   Therapist examined pt's left foot and ankle. No edema noted and pt with good range of motion in all planes at ankle. Her pain seems to be start at the arch of her foot, and goes up her leg.  Pt progressed with dynamic strengthening exercises for hip including lunge forward/backward and tandem gait. Pt able to perform with good technique and verbalized understanding to use a wall to stabilize herself with this exercise for safety.  Isometric cervical exercises  performed, including cervical flexion, extension and lateral flexion. Pt demonstrated good technique.  Handout provided in both English and with this therapist translating into Spanish with these exercises. Pt indicated understanding of the handouts.    Education:    Primary learner: patient  Preferred learning style:  demonstration, written, verbal and interpreter needed  Barriers to learning: none   Cultural practices that influence care: none   Topic taught: HEP progression, current walking speed slightly below age predicted normal, advised pt to wear shoes with good arch support inside the house as well   Response to education: pt verbalized understanding and demonstrated teach-back of HEP      ASSESSMENT/PATIENT RESPONSE TO THERAPY:  Pt demonstrates excellent motivation to improve and has shown good improvement of her dizziness symptoms. She does still experience what she calls flashes, which cause her to lose her balance. She was advised to speak with her neurologist regarding these. Other than the flashes, her dizziness seems to have improved, though she is still deconditioned and experiencing weakness particularly on her left side. Her left leg pain may be stemming from a plantar's fasciosis and so she was advised to wear shoes with good arch support. She will benefit from ongoing outpatient physical therapy to improve her balance, strength  and activity tolerance.    PLAN:   Continue intervention per Plan of Care.     Plan for next visit: Re-assess FGA, assess DHI, progress strengthening exercises    Donnamarie Rossetti PT DPT NCS  Physical Therapist  [facility]  [facility]

## 2016-11-07 NOTE — Progress Notes (Signed)
[facility] PHYSICAL THERAPY PLAN OF CARE REVIEW      L & I Claim #: N/A    Certification From: 11/08/16  Certification To: 12/09/16    Date of Symptom Onset : 07/21/16  Start of Care Date: 10/03/16  VISITS FROM SOC: 5    Referring Provider: Gloria von Geldern MD  Interpreter Status: Yes - In Person    Precautions:    Precautions: none                      SUBJECTIVE:  Pt states that her left leg is feeling much better and her ankle/foot are feeling better but her upper leg and calf still are sore. She has been walking more. She also states that she has had no recent flashes of dizziness recently. She did have terrible pain in her brain due to the MS on Tuesday and Wednesday of this week but it has since resolved.  Pain Score: 4 (left leg)    OBJECTIVE:  Functional Gait Assessment: 22/30   Gait Level surface: 3   Change in gait speed: 3   Gait with horizontal head turns: 2 (dizzy with left head turn)   Gait with vertical head turns: 2   Gait and pivot turn: 3   Step over obstacle: 3   Gait with narrow base of support: 2   Gait with eyes closed: 0   Ambulating backwards: 2   Steps: 2  FGA cutoff score of 22/30 is effective in classifying fall risk in older adults and predicting unexplained falls in community-dwelling older adults.    Dizziness Handicap Inventory  Does looking up increase your problem? 0  Because of your problem do you feel frustrated? 2  Because of your problem do you restrict your travel for business of pleasure? 0  Does walking down the aisle of a supermarket increase your problem? 0  Because of your problem, do you have difficulty getting into or out of bed? 2  Does your problem significanlty restrict your participation in social activities, such as going out to dinner, going to movies, dancing or to parties? 2  Because or your problem do you have trouble reading? 4  Does performing more ambitious activities like sports, dancing and household chores, such as sweeping or putting dishes away, increase  your problem? 0  Because of your problem, are you  afraid to leave your home without someone accompanying you? 2  Because of your problem, have you been embarrassed in front of others? 0  Do quick movements of your head increase your problem? 4  Because of your problem, do you avoid heights? 4  Does turning over in bed increase your problem? 2  Because of your problem, is it difficult for you to do strenuous housework or yard work? 2  Because of your problem, are you afraid people may think you are intoxicated? 4  Because of your problem, is it difficult for you to go for a walk by yourself? 2  Does walking down a sidewalk increase your problem? 0  Because of your problem, is it difficult for you to concentrate? 4  Because of your problem, is it difficult for you to walk around your house in the dark? 4  Because of your problem, are you afraid to stay home alone? 0  Because of your problem, do you feel handicapped? 0  Has your problem placed stress on your relationship with many of your family or friends? 0  Because   of your problem, are you depressed? 2  Does your problem interfere with your job or household responsibilities? 0  Does bending over increase your problem? 4    Total score 44/100  Scores 16-34 indicate mild handicap  36-52 points indicate moderate handicap  54+ points indicate severe handicap      INTERVENTION:    Intervention 1- Neuromuscular re-ed x 35 Minutes:   FGA re-assessed, see above.  [place] assessed with pt requiring significantly increased time to complete due to wanting to provide complete explanation of her symptoms with each question.    Non-billable Time x 10 minutes:  Pt reported feelings of insecurity relating to her MS diagnosis. Suggested rehab psychology, which pt initially was resistant to but with education for its emphasis on coping skills relating to neurologic diagnoses pt agreed that it may be helpful. Will request a referral from Dr. Lorrin Mais.    Education:    Primary learner:  patient  Preferred learning style:  demonstration, verbal and interpreter needed  Barriers to learning: none   Cultural practices that influence care: none   Topic taught: FGA score improvement, current moderate impairment from dizziness, role of rehab psychology   Response to education: pt verbalized understanding        ASSESSMENT: Pt presents with the following:  Pt presents with the following:  Impairments:  impaired strength, feelings of vertigo and dysequilibrium, impaired activity tolerance  Activity Limitations: fall risk, limited in household and community mobility  Participation Restrictions: limited in role as mother and grandmother, unable to work  Clinical Presentation for Selection of Evaluation Code: Unstable  Clinical Decision Making Complexity:High  Netra Lorenna Lurry has shown excellent motivation for outpatient physical therapy. She has been compliant with her HEP and reports that her dizziness has improved. Her FGA score of 16/30 on 21 December indicated that she was at a high fall risk. Today she scored 22/30, indicating an improvement in her balance and mobility. Chanya continues to demonstrate moderate impairment from her dizziness though, as evidenced by her score of 44/100 on the Dizziness Handicap Inventory. As this seems to be causing her some distress, will request a referral for rehab psychology. At this time, I only plan to see Karnisha for one additional PT session but if Dr. Cruzita Lederer Geldern's appointment next week reveals a new component to her diagnosis or treatment plan, would consider requesting more sessions. She will benefit from ongoing outpatient physical therapy to progress her balance and strength and continue to address her vestibular impairments.    Fall risk based on assessment: low     Rehabilitation potential is: Good    Potential Barriers to achieve rehab goals: chronicity of impairment    Care Connections Functional Index Score: Outcome Score: 43    GOALS:    Discharge  Goals:  1.  Pt will consistently perform comprehensive HEP for vestibular rehab as well as strength and balance  2. Pt will report dizziness no worse than 3/10 throughout her day  3. Pt will increase 6 minute walk test distance to age-predicted normal to indicate improved activity tolerance  4. Pt will score >24/30 on the FGA      Previous Monthly Goals Progress Toward Monthly Goals/  Current Level of Function Monthly Goals      11/07/2016 To be reviewed by:  12/09/2016   Therapist will assess FGA to determine fall risk Met, pt scored 16/30 on 21 December and 22/30 today.   Pt will score > 24/30 on FGA  to indicate decreased fall risk.   Pt will perform HEP for peripheral hypofunction as well as motion sensitivity Met   Pt will perform comprehensive HEP for vestibular rehab as well as strength and balance   Therapist will assess Deuel to determine severity of impairment Met, pt scored 44/100   No further goal at this time    Pt ambulated 425mon 6MWT Pt will ambulate >5319mn 6MWT              The above goals and plan of care have been discussed and agreed upon by the patient.    PLAN:   Patient will be seen for 1x/week for approximately 1 additional visits      Planned interventions: Therapeutic Activities, Therapeutic Exercise, Neuromuscular Re-education, Gait Training    Plan for next visit:  Introduce progression for gaze stabilization exercises, review HEP for strength and balance    Anticipated discharge date: January 2018    AnNinetta LightsT DPT NCS  Physical Therapist  [facility] ([facility]

## 2016-11-08 ENCOUNTER — Ambulatory Visit (HOSPITAL_BASED_OUTPATIENT_CLINIC_OR_DEPARTMENT_OTHER): Payer: Self-pay | Admitting: Rehabilitative and Restorative Service Providers"

## 2016-11-08 DIAGNOSIS — Z7409 Other reduced mobility: Secondary | ICD-10-CM

## 2016-11-08 DIAGNOSIS — G35 Multiple sclerosis: Secondary | ICD-10-CM

## 2016-11-12 ENCOUNTER — Other Ambulatory Visit: Payer: Self-pay | Admitting: Neurology

## 2016-11-12 ENCOUNTER — Ambulatory Visit: Payer: Self-pay | Attending: Neurology | Admitting: Neurology

## 2016-11-12 ENCOUNTER — Ambulatory Visit: Payer: Self-pay | Attending: Neurology

## 2016-11-12 VITALS — BP 106/53 | HR 72 | Temp 97.8°F | Ht 63.0 in | Wt 127.0 lb

## 2016-11-12 DIAGNOSIS — G379 Demyelinating disease of central nervous system, unspecified: Secondary | ICD-10-CM

## 2016-11-12 DIAGNOSIS — R768 Other specified abnormal immunological findings in serum: Secondary | ICD-10-CM

## 2016-11-12 DIAGNOSIS — G35 Multiple sclerosis: Secondary | ICD-10-CM

## 2016-11-12 DIAGNOSIS — Z6822 Body mass index (BMI) 22.0-22.9, adult: Secondary | ICD-10-CM

## 2016-11-12 DIAGNOSIS — G43709 Chronic migraine without aura, not intractable, without status migrainosus: Secondary | ICD-10-CM

## 2016-11-12 MED ORDER — NORTRIPTYLINE HCL 25 MG OR CAPS
ORAL_CAPSULE | ORAL | 6 refills | Status: DC
Start: 2016-11-12 — End: 2017-06-20

## 2016-11-12 NOTE — Patient Instructions (Addendum)
-   Start Nortriptyline 1 tab at night for 1 week, then take 2 tabs at night    - Continue Vitamin D    - Rheumatology (because of the blood test that is concerning for Sjoegren's disease): [phone]    - Please contact our clinic with any new or worsening neurological problems lasting more than 24 hours    - The MS medications we could think about are Betaseron or Copaxone    - Return to see me in 3-6 months, sooner if needed

## 2016-11-12 NOTE — Progress Notes (Signed)
I personally saw and evaluated the patient. I discussed the patient with Dr. Jackquline Bosch. I agree with the findings and plan as documented in the note above. I spent 40 minutes with the patient; greater than 50% of time was spent in counseling and coordination of care.    In Summary,     [name] is a 56 y/o woman prior diagnosis of multiple sclerosis, currently on no DMT, presenting for re-evaluation.     1. Disease modifying therapy: Reviewed today's MRI which show extensive periventricular and subcortical non-enhancing white matter lesions and non-enhancing cord lesion at T2. MRI brain is unchanged to 2017. Discussed option of starting safe DMT (glatiramer may be best option; patient also tolerated Betaseron well per her recollection, though I am somewhat concerned about increase in headaches on interferons). However, given her stable imaging off DMT (for 8 years) and lack of recent relapses (though limited historian), it also seems reasonable to hold off on DMT.   - Continue Vitamin D  - Not using tobacco  - Please contact our clinic with any new or worsening neurological problems lasting more than 24 hours  - Will discuss DMTs again at next visit    2. Symptom management:   - Migraines: Headaches are a major problem. Extensively discussed risk of rebound/overuse headache and urged her to limit use of Celebrex or Advil etc. Start Nortriptyline 25mg po QHS for 1 week, then 50mg  po at night  - Vertigo: Continue [facility] at [facility] for balance training and vestibular therapy  - Bladder problems: Plan bladder scan at next visit  - Fatigue: TSH normal in 07/2015. CBC within normal limits. Consider trial of Amandine.     3. Positive anti-SSA:  - Referral to Rheumatology for assessment    Return to see me in 3-6 months, sooner if needed

## 2016-11-12 NOTE — Progress Notes (Addendum)
MULTIPLE SCLEROSIS CENTER FOLLOW-UP NOTE       Chief Complaint: Multiple Sclerosis    History of Presenting Illness Summary:   2004: bad headaches back of head, balance problems, difficulty holding up head  2005: vision problems (double vision in distance, next to each other "like thicker")  Saw a neurologist in [place] for this and was told she has MS    Diagnosis: Outside diagnosis of MS in 2004 in [place] based on MRI/history, LP/VEP negative, no spinal cord lesions  Current DMT: None  Past DMT: Betaseron 2004-2010 (stopped after prior neurologist moved away and concern that this is not relapsing MS)  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 Headaches. Happened frequently before starting to Betaseron, less while on the drug but now worse  Imaging:    MRI Brain 01/18: As compared to 11/26/2013, multiple grossly unchanged foci of white matter signal abnormality within the brain, accounting for differences in technique.   Relatively nonspecific and could be seen in setting of chronic ischemic vascular disease or demyelinating disease. No abnormal enhancement detected. No PML.  MRI brain 02/15: Stable white matter lesions periventricular and pericallosal.    MRI C and T spine 01/18: No cervical cord lesions. Stable focal T2 prolongation involving the thoracic spine at T2-T3 seen only on axial imaging and not well correlated on sagittal images. No new lesion or abnormal cord enhancement detected.  MRI C and T spine 04/15: No spinal cord lesions. Hyperintense lesion lower pole right kidney. C4-5 disc protrusion, effaces ventral thecal sac but does not deform the cord      Interval History:   - Last visit with Dr Elenor Legato on 09/20/2016  - First language Spanish, speaks decent English, seen without interpreter  - Reports that she had a "crisis" in end of  December - was not able to see/speak as usual, had balance problems and difficulty walking. Also points to her head and says that there was pain "like the skin was being pulled"  - Differed from usual as it only lasted 2 days, usually lasts for 2 weeks  - Reviewed [place] records from [facility]   - Has had positive ant-Ro antibodies in the past, may have seen a Rheum doctor but not sure  - Had MRI earlier today  - Doing PT and last appointment is on 1/31  - Continues to take Vitamin D  - Takes Advil or Celebrex for pain    ROS:     Symptoms  Fatigue: Very much  Sleep: Not at all  Double vision: Not at all  Blurry vision: Quite a bit  Swallowing problems: A little bit  Dizziness / light headedness: Quite a bit  Numbness or tingling or odd sensations: A little bit  Pain: Quite a bit  Weakness: A little bit  Falling: A little bit  Spasms or jerking: Not at all  Tightness or stiffness: Not at all  Bladder problems: A little bit  Bowel problems: Not at all  Depression: A little bit  Anxiety: Quite a bit  Problems thinking: Not at all  Heat sensitivity: Not at all  Skin problems (e.g., injection site reaction): Not at all  Heart palpitations: Quite a bit  Shortness of breath: Quite a bit      Data:     MRI brain 02/15: Stable white matter lesions  periventricular and pericallosal.    MRI C and T spine 04/15: No spinal cord lesions. Hyperintense lesion lower pole right kidney. C4-5 disc protrusion, effaces ventral thecal sac but does not deform the cord      Physical Exam:   BP 106/53   Pulse 72   Temp 97.8 F (36.6 C) (Temporal)   Ht 5\' 3"  (1.6 m)   Wt 127 lb (57.6 kg)   BMI 22.50 kg/m        28ft Walk Exam  Mobility: No Mobility Aids    Mental Status: alert & oriented, during today's encounter normal attention, concentration, language, calm and appropriate in contact and mood balanced   Cranial Nerves: appropriate pupillary responses, appropriate visual fields, EOMI, no nystagmus, no dysconjugate gaze, no INO, face  symmetrical, no facial paresis, facial sensation decreased left face to light touch and cold, hearing intact to finger rub, speech fluent, no dysarthria, palate elevates symmetrically, sternocleidomastoids strong and tongue midline without fasciculations   Motor:    Tone: normal.     Atrophy/Fasiculations: not observed     Pronator Drift: none.     Strength: 5/5 in all muscle groups of the upper and lower extremities   Reflexes: present and symmetrical throughout   Coordination: finger to nose with no dysmetria, fine finger movements and foot tapping intact   Gait and Balance: gait within exam room intact, can walk on heels and toes; tandem walk and Romberg mildly unsteady  Sensory: decreased sensation left arm and leg to vibration, light touch and pin prick    Assessment and Plan:  [name] is a 56 year old woman with prior diagnosis of multiple sclerosis, currently on no DMT, presenting to establish care.     1. Diagnosis: History obtained from patient is somewhat limited but it seems like she may have had episodes of neurological symptoms compatible with MS in the past. It is unclear how much her neurological symptoms have been accompanied by headaches as complicated migraines are a differential consideration. Reviewed records [facility], patient had negative LP and normal visual evoked potentials. Prior spinal cord MRI's were negative as well. Review of today's MRI brain 10/2016 showed notable lesion burden (some pericallosal, juxtacortical and subcortical lesions, 1 punctate infratentorial lesion) which was stable since 2015. However on the MRI spine, one focal cord hyperintensity seen on axial images but not on sagittal sequences (stable from prior 2015 scan but outside report reported no lesions). She could well have multiple sclerosis given the lesion burden in the MRI but the unusual symptom constellation, negative LP, normal VEP and the no clear spinal cord lesions make the diagnosis less  clear. She has no significant vascular risk factors however she has positive ANA and Ro serology which could be an epi-phenomenon or white matter lesions could be related to underlying Sjogren's etiology. She reports dry eyes and dry mouth but only since she moved to [place] state.   - Referral to Rheumatology regarding positive anti-Ro serology and rule out Sjogren's syndrome  - Please call the clinic if there are new or worsening neurological symptoms  - Continue Vitamin D supplementation 2000 U daily. Last level checked in [place] was 28 (07/2015).    2. Disease modifying therapy: She has been off Betaseron since 2010 and MRI's have been stable in the interim. Her clinical episodes or "crisis" are not classic for MS relapse and could be secondary to atypical migrainous episodes. It would be reasonable for clinical and radiological monitoring  at this time and discussed with the patient on this approach. If she has any new symptoms, worsening of her prior "crisis" episodes, changes on her MRI we will have to consider a safe DMT. Options include going back to Betaseron or a new medication like Copaxone. Patient feels comfortable with clinical and MRI monitoring at this time.     3. Symptom management:  - Headaches : Start Nortriptyline 25 mg at night x 1 week followed by 50 mg at night for headaches.   - Vertigo: Continue Physical Therapy at [facility] for balance training and vestibular therapy  - Bladder problems: Plan bladder scan at next visit  - Fatigue: TSH normal in 07/2015. CBC within normal limits. Consider trial of Amandine.     Return to see Dr Elenor Legato in 3-6 months    Histories, medications and problem list have been reviewed and updated as appropriate: YES.

## 2016-11-15 ENCOUNTER — Encounter (HOSPITAL_BASED_OUTPATIENT_CLINIC_OR_DEPARTMENT_OTHER): Payer: Self-pay | Admitting: Rheumatology

## 2016-11-19 NOTE — Progress Notes (Signed)
[facility] PHYSICAL THERAPY DISCHARGE NOTE  L & I Claim #: N/A    _X_ Discontinue Therapy Services    Date of Symptom Onset : 07/21/16  Start of Care Date: 10/03/16  VISITS FROM SOC: 6    Referring Provider: Deniece Ree MD  Interpreter Status: Yes - In Person    SUBJECTIVE: Pt states that the dizziness has improved but she had a flash yesterday that she believes is from the new medication. Pt states that the new medication makes her feel light headed and weak. Overall, pt states that she is feeling much better and feels that she has significantly improved. Pt tearful when she thinks about the progress that she has made and states that she is very grateful that PT has helped so much with her dizziness and balance.    OBJECTIVE:   Functional Gait Assessment: 22/30   Gait Level surface: 3   Change in gait speed: 3   Gait with horizontal head turns: 2   Gait with vertical head turns: 3   Gait and pivot turn: 2   Step over obstacle: 3   Gait with narrow base of support: 1   Gait with eyes closed: 1 (with leftward veer)   Ambulating backwards: 2   Steps: 2  FGA cutoff score of 22/30 is effective in classifying fall risk in older adults and predicting unexplained falls in community-dwelling older adults.    6 minute walk test assessed, pt able to ambulate 1350' (466m in 6 minutes.    Intervention:   Intervention 1- Neuromuscular re-ed x 15 minutes:   FGA re-assessed, see above for details.  Pt advised to continue with her HEP for balance and vestibular hypofunction and to perform them in the afternoons when she is feeling stronger.    Intervention 2- Gait re-ed x 10 minutes:   6 minute walk test re-assessed, see above.    Non-billable Time x 15 minutes:   CareConnections form filled out again. Pt's score lower than on initial assessment (was 43, now 22) because pt answered the questions for how she felt when she is in a "crisis", or MS flare-up. Pt states that she is open to rehab psychology, although she feels that she  gets a lot of strength from her faith. Pt advised that rehab psychology may help her to deal with the stress that she feels when she thinks about her MS, as pt reports that this worsens her symptoms    Education:    Primary learner: patient  Preferred learning style:  demonstration, verbal and interpreter needed  Barriers to learning: none   Cultural practices that influence care: none   Topic taught: advice to continue with HEP, rehab psychology benefit   Response to education: pt verbalized understanding      ASSESSMENT: Pt presents with the following:  Impairments: impaired strength, feelings of vertigo and dysequilibrium, impaired activity tolerance  Activity Limitations: fall risk, limited in household and community mobility  Participation Restrictions: limited in role as mother and grandmother, unable to work  Clinical Presentation for Selection of Evaluation Code:Unstable  Clinical Decision Making Complexity:High  Debra GWeston Fulcohas shown excellent motivation for outpatient physical therapy. She has been compliant with her HEP and reports that her dizziness has improved. Her FGA score of 16/30 on 21 December indicated that she was at a high fall risk. Today she scored 22/30, indicating an improvement in her balance and mobility. This is the same score that she received on 08 November 2016 but today pt is reporting weakness due to her new medication. On 12 January she was able to ambulate 464mon a 6 minute walk test and today she ambulated 453mn the 6 minute walk test. This score is likely due to her feeling fatigued today and also being assessed in the morning, when she reports the weakness is worse since starting her new medication. As Dekayla has been compliant with her home exercise program and has made nice progress with her strength and dizziness, will plan to discharge from outpatient PT today with the advice for the pt to continue performing her home exercise program.       Care Connections  Functional Index Score: Outcome Score: 22 (assessed for when pt is in a "crisis" or flare-up of her MS)    GOALS:     Discharge Goals Progress Toward Discharge Goals/  Current Level of Function    11/19/2016   Pt will consistently perform comprehensive HEP for vestibular rehab as well as strength and balance Met     Pt will report dizziness no worse than 3/10 throughout her day Met     Pt will increase 6 minute walk test distance to age-predicted normal to indicate improved activity tolerance Not met due to pt reporting weakness     Pt will score >24/30 on the FGA Not met, pt plateaued at 22/30 on FGA             DISCHARGE PLAN:   Patient will be discharged from [facility].    The patient is in agreement with this plan.    Patient will continue to perform daily written home exercise program.    AnNinetta LightsT DPT NCS  Physical Therapist  [facility] ([facility]

## 2016-11-20 ENCOUNTER — Other Ambulatory Visit (HOSPITAL_BASED_OUTPATIENT_CLINIC_OR_DEPARTMENT_OTHER): Payer: Self-pay | Admitting: Neurology

## 2016-11-20 ENCOUNTER — Ambulatory Visit (HOSPITAL_BASED_OUTPATIENT_CLINIC_OR_DEPARTMENT_OTHER): Payer: Self-pay | Admitting: Rehabilitative and Restorative Service Providers"

## 2016-11-20 DIAGNOSIS — G35 Multiple sclerosis: Secondary | ICD-10-CM

## 2016-11-20 DIAGNOSIS — Z7409 Other reduced mobility: Secondary | ICD-10-CM

## 2016-11-20 DIAGNOSIS — G379 Demyelinating disease of central nervous system, unspecified: Secondary | ICD-10-CM

## 2016-12-03 ENCOUNTER — Telehealth (HOSPITAL_BASED_OUTPATIENT_CLINIC_OR_DEPARTMENT_OTHER): Payer: Self-pay

## 2016-12-03 DIAGNOSIS — G35 Multiple sclerosis: Secondary | ICD-10-CM

## 2016-12-03 NOTE — Telephone Encounter (Signed)
Called pt with a Spanish interpreter. Pt would like to see the rehab psychologists at the MS clinic, Referral pended.        Please check with pt if she needs to be seen in [facility] or if she is willing to come here. Please pend order. Thanks. GvG    Previous Messages      ----- Message -----   From: Onalee Hua, PT   Sent: 11/08/2016  5:06 PM   To: Harmon Dun, MD     Hello Dr Elenor Legato,     When I saw Liya today she expressed some coping difficulties with her MS and I suggested that rehab psychology may be helpful. She was reluctant at first but then came around to it. Would you consider writing this referral?     Thank you,   Mardelle Matte PT

## 2016-12-13 ENCOUNTER — Ambulatory Visit (HOSPITAL_BASED_OUTPATIENT_CLINIC_OR_DEPARTMENT_OTHER): Payer: Self-pay | Attending: Rheumatology | Admitting: Rheumatology

## 2016-12-13 ENCOUNTER — Encounter (HOSPITAL_BASED_OUTPATIENT_CLINIC_OR_DEPARTMENT_OTHER): Payer: Self-pay | Admitting: Rheumatology

## 2016-12-13 VITALS — BP 118/54 | HR 72 | Temp 96.8°F | Ht 61.0 in | Wt 124.0 lb

## 2016-12-13 DIAGNOSIS — Z7409 Other reduced mobility: Secondary | ICD-10-CM

## 2016-12-13 DIAGNOSIS — H04123 Dry eye syndrome of bilateral lacrimal glands: Secondary | ICD-10-CM

## 2016-12-13 DIAGNOSIS — Z6823 Body mass index (BMI) 23.0-23.9, adult: Secondary | ICD-10-CM

## 2016-12-13 DIAGNOSIS — R768 Other specified abnormal immunological findings in serum: Secondary | ICD-10-CM

## 2016-12-13 DIAGNOSIS — G35 Multiple sclerosis: Secondary | ICD-10-CM

## 2016-12-13 LAB — COMPLEMENT C4: Complement C4: 43 mg/dL (ref 13–50)

## 2016-12-13 LAB — C_REACTIVE PROTEIN: C_Reactive Protein: 1.6 mg/L (ref 0.0–10.0)

## 2016-12-13 LAB — RHEUMATOID FACTOR: Rheumatoid Factor: <10 [IU]/mL (ref <15)

## 2016-12-13 LAB — CK, CREATINE KINASE, TOTAL ACTIVITY: Creatine Kinase Total Activity: 74 U/L (ref 43–274)

## 2016-12-13 LAB — COMPLEMENT C3: Complement C3: 157 mg/dL (ref 87–200)

## 2016-12-13 LAB — SED RATE: Erythrocyte Sedimentation Rate: 10 mm/h (ref 0–20)

## 2016-12-13 NOTE — Patient Instructions (Addendum)
--  Sjogren's syndrome is unlikely. However, we should do some more bloodwork today. When you come back, we will do a schirmer's test to test the degree of dryness in your eyes, to see if they as severely dry as would be expected in Sjogren's syndrome.   --return in 1-2 weeks      --el sndrome de Sjogren es improbable. 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 Cuando usted vuelve, haremos una prueba de Schirmer para probar el grado de sequedad en sus ojos, para ver si ellos tan seriamente secos como cabra esperar en Sjogren ' sndrome de s.   --el laboratorio se encuentra en este edificio en la plant[place] b[place]  --volver en 1-2 semanas para discutir los [place] de laboratorio y Radio producer la prueba de Schirmer

## 2016-12-13 NOTE — Progress Notes (Signed)
[facility] Medicine - [facility] [place] [address]   TEL: [phone] l FAX: [phone]        PROBLEM LIST/PMH:  Patient Active Problem List    Diagnosis Date Noted   . Impaired functional mobility, balance, and endurance 10/03/2016   . Multiple sclerosis ([place]) 09/18/2016   . Chronic migraine without aura without status migrainosus, not intractable 09/18/2016   . Vertigo 09/18/2016       PRIMARY CARE PROVIDER:  Frutoso Schatz, MD  [facility] [address] [place] [address]    CONSULTING PROVIDER:  [place]  [address]  [place] [address]    PATIENT: Debra Torres    [id number]    IDENTIFYING DATA/CC:    Debra Torres is a 56 year old female seen today for an initial consultation for positive ANA screen (negative by IFA) and Anti SSA/Ro antibody, evaluate for Sjogren's syndrome. She was interviewed with the assistance of a Spanish telephonic interpreter.    HPI:  Debra Torres is a 56 year old female with possible MS; given this diagnosis several years ago (2004) while living in [place]. MRI brain with multiple lesions but had negative spinal cord MRI, LP and normal visual evoked potentials. Treated with injectable medications from around 2004-2014. Has had intermittent symptoms which she describes as "crises."     Currently, the diagnosis of MS is less clear. Moved to [place] to live with her daughter and was seen recently in the [place] at [facility] by Dr. Lorrin Mais - symptoms may be consistent with MS - but prior negative MRI of the spinal cord, negative LP and normal visual evoked potentials makes the diagnosis less clear.  MRI brain 10/2016 showed notable lesion burden (some pericallosal, juxtacortical and subcortical lesions, 1 punctate infratentorial lesion) which was stable since 2015. However on the MRI spine, one focal cord hyperintensity seen on axial images  but not on sagittal sequences (stable from prior 2015 scan but outside report reported no lesions).    Debra Torres reports that her "crises" last 3-4 days, can't get up, speak, walk. Cannot move her mandible so cannot eat. Has pain in the chest and has to force herself to breathe. Takes another week to recover.    Last crisis was 2 weeks ago. Thinks this was related to hearing the news regarding her MRI brain and spine; was not exactly consistent with prior episodes. Had an episode of vertigo recently but recovered with physical therapy. No episodes of vertigo in the past 2 weeks. Between episodes not quite completely normal; stumbles, has trouble holding her cup. In the morning, feels short of breath; has to concentrate on regulating her breathing. Subsequently (after a few minutes), her breathing is normal. Has a little trouble going up stairs and after walking for a long time (100 meters). Not sure if this is related to her deconditioning or a primary respiratory issue.    Has muscular pain in the arms and legs when she overdoes it. When she does regular activities, such as going to the grocery store, she feels this pain. Feels better with rest.  Endorses a general feeling of weakness/being debilitated. Has some pain in the back (lumbar spine, where she had the lumbar puncture in the past). Does not radiate. Feels it after activity (for instance, after sweeping the floor).  No stiffness, just pain. Denies other joint pain. No rashes. Has dry eyes. Was given some lubricating drops. Uses them at night before she lays down to rest. No problem with tear production, cries a lot when watching a sad movie, etc. Also has eye redness, blurry vision. No dry mouth. Started losing her teeth in 2013; diagnosed with a problem in the gums, possibly infection. No history of parotitis or salivary gland inflammation. No ulcers in the mouth or the genital area. No photosensitivity. No problems chest pain apart from the crises. As above,  some breathing difficulty for a few minutes in the morning and after exerting herself, which improves with her breathing exercises. Attributes this to MS.  No fevers or night sweats. No weight loss. No headaches. Also has a pain in her skull; feels dented in the back. Feels like it is getting larger. Had some hair loss in the past, not currently.     ROS:  Comprehensive review of systems completed, and reported in the HPI. Otherwise negative.     PAST MEDICAL HISTORY:  Possible MS  Vertigo  (migraines per neuro note but patient denies a history of headaches today)    PAST SURGICAL HISTORY:  Hysterectomy 1987  cholecystectomy 2014    Current Medications:  Current Outpatient Prescriptions   Medication Sig Dispense Refill   . Celecoxib (CELEBREX OR)      . Cholecalciferol (VITAMIN D) 2000 units Oral Cap Take 2,000 Units by mouth.     . Ibuprofen (ADVIL OR)      . Nortriptyline HCl 25 MG Oral Cap Take 13m (1 cap) at night x1 week, then 562m(2 cap) at night 60 capsule 6     No current facility-administered medications for this visit.        Allergies:  Review of patient's allergies indicates:  Allergies   Allergen Reactions   . Morphine    . Penicillins    . Tramadol        Family History:  No history of multiple sclerosis or other autoimmune disorders.   Sister had uterine cancer  Grandmother had "cancer in the intestines"  Mom and dad are in decent health. Mom has heart problems.      Social History:  Social History     Social History   . Marital status: Divorced     Spouse name: N/A   . Number of children: N/A   . Years of education: N/A     Occupational History   . Not on file.     Social History Main Topics   . Smoking status: Never Smoker   . Smokeless tobacco: Never Used   . Alcohol use Not on file   . Drug use: Unknown   . Sexual activity: Not on file     Other Topics Concern   . Not on file     Social History Narrative    Spanish speaking. Used to work in a [place] [place] unable to work due to health  problems. [place]      No ETOH or cigarettes. Living with her daughter. Hasn't worked for the past year. Worked in a [place] [employer].     PHYSICAL EXAM:  BP 118/54   Pulse 72   Temp 96.8 F (36 C) (Temporal)   Ht _0  (1.549 m)   Wt 124 lb (56.2 kg)   SpO2 97% Comment: RA  BMI 23.43  kg/m   General: Comfortable appearing. No apparent distress.  Eyes: No conjunctival irritation or injection.  Pterygium left eye.   Oropharynx: Lips, mucosa, and tongue normal. Normal salivary pooling.  Upper denture.    Lymph: no cervical or supraclavicular  lymphadenopathy  Neck:  Supple.   Lungs:  Easy work of breathing on room air.  Clear to auscultation bilaterally.  Heart: normal rate, regular rhythm, no murmurs or gallops  Abd: soft, non-tender  MS: All joints were examined. There are no tender or swollen joints.  Full range of motion.   Neuro: Motor testing of the upper and lower extremities is +5/5  Skin: no rashes or nodules  Psych:  Appropriate affect    Labs:     Results for orders placed or performed in visit on 12/13/16   COMPLEMENT C4   Result Value Ref Range    Complement C4 43 13 - 50 mg/dL   COMPLEMENT C3   Result Value Ref Range    Complement C3 157 87 - 200 mg/dL   SED RATE   Result Value Ref Range    Erythrocyte Sedimentation Rate 10 0 - 20 mm/h   CRP, HIGH SENSITIVITY   Result Value Ref Range    C_Reactive Protein 1.6 0.0 - 10.0 mg/L   PROTEIN ELECTR. REFLX PNL   Result Value Ref Range    Protein (Total) 7.6 6.0 - 8.2 g/dL    Albumin  3.5 - 4.9 g/dL    Alpha 1  0.1 - 0.3 g/dL    Alpha 2  0.3 - 0.8 g/dL    Beta  0.6 - 1.0 g/dL    Gamma  0.4 - 1.4 g/dL    Electrophoresis Interp:  NORPAT   RHEUMATOID FACTOR   Result Value Ref Range    Rheumatoid Factor <10 <15 [IU]/mL   CREATINE KINASE TOTAL ACTIVITY   Result Value Ref Range    Creatine Kinase Total Activity 74 43 - 274 U/L         Imaging:   MRI Brain with/without contrast and  T/C spine with contrast 11/12/2016  1. As compared to 11/26/2013, multiple grossly unchanged foci of white matter signal abnormality within the brain, accounting for differences in technique. Note that findings are relatively nonspecific and could be seen in setting of chronic ischemic vascular disease or demyelinating disease. No abnormal enhancement detected. No PML.    2. Stable focal T2 prolongation involving the thoracic spine at T2-T3 seen only on axial imaging and not well correlated on sagittal images. No new lesion or abnormal cord enhancement detected.       Impression and Recommendations:   Debra Torres is a 56 year old female seen today for an initial consultation for positive ANA screen (negative by IFA) and Anti SSA/Ro antibody, evaluate for Sjogren's syndrome.    #Positive antiSSA antibody (+ANA screen, negative by IFA)  #Possible MS  #Dry eyes  Debra Torres does not have a clear diagnosis of Sjogren's syndrome at this time. Factors potentially arguing in favor of the diagnosis include her positive antiSSA antibody, dry eyes (though clinically not severe, but will test this formally at her next visit), and potentially her intermittent neurologic symptoms.  Although there remains a lot of uncertainly surrounding CNS disease in Sjogren's patients, it has been reported to mimic MS (including relapsing remitting MS) and can present years before the patient clinically meets criteria for Sjogren's (Delalande et al 2004).   On the other hand, she lacks dry mouth  as well as other consistent symptoms and other lab results that are consistent with Sjogren's; patients with Sjogren's typically have dry eyes and dry mouth plus some objective signs of severe dryness (positive Schirmer test or positive ocular surface staining, reduced unstimulated whole salivary flow, suggestive parotid/salivary scintigraphy, or imaging (typically [place] or MRI) showing characteristic findings in the salivary glands, plus either positive  autoantibodies (+antiSSA or +antiSSB or +ANA by IFA, typically with a centromere pattern, or +ANA>/=1:320 with a positive RF) or a positive lip biopsy with a  focal lymphocytic sialadenitis with focus score ?1. Patients with Sjogren's also often have an elevated ESR (due to hypergammaglobulinemia).  --Check RF, complements (C3 and C4), ESR, CRP, CK, SPEP with immunofixation. Results all normal thus far; SPEP with immunofixation is still pending  --will return next week to review results and complete objective test of eye dryness (Schirmers test)      Followup: 1 week    I spent a total of 60 minutes face-to-face with the patient, of which more than 50% was spent counseling and coordinating the patient's care as outlined above.         Jenna L. Terrial Rhodes MD, MPH  [address], [place] [address]  Pager:   [phone]  FAX:    [phone]  Email:    [email or web address]        Reference:  Delalande S, de Seze Lenna Sciara, Fauchais AL, Langlois E, Stojkovic T, Ferriby D,  Dubucquoi S, Pruvo JP, Vermersch P, Hatron PY. Neurologic manifestations in  primary Sjgren syndrome: a study of 82 patients. Medicine [facility]). 2004  Sep;83(5):280-91. PubMed PMID: [id number].

## 2016-12-16 LAB — IMMUNOFIXATION

## 2016-12-16 LAB — PROTEIN ELECTR. REFLX PNL
Albumin: 5.3 g/dL — ABNORMAL HIGH (ref 3.5–4.9)
Alpha 1: 0.1 g/dL (ref 0.1–0.3)
Alpha 2: 0.6 g/dL (ref 0.3–0.8)
Beta: 0.7 g/dL (ref 0.6–1.0)
Gamma: 0.9 g/dL (ref 0.4–1.4)
Protein (Total): 7.6 g/dL (ref 6.0–8.2)

## 2016-12-17 ENCOUNTER — Encounter (HOSPITAL_BASED_OUTPATIENT_CLINIC_OR_DEPARTMENT_OTHER): Payer: Self-pay | Admitting: Clinical Psychologist

## 2016-12-18 ENCOUNTER — Ambulatory Visit (HOSPITAL_BASED_OUTPATIENT_CLINIC_OR_DEPARTMENT_OTHER): Payer: Self-pay | Attending: Rheumatology | Admitting: Rheumatology

## 2016-12-18 ENCOUNTER — Encounter (HOSPITAL_BASED_OUTPATIENT_CLINIC_OR_DEPARTMENT_OTHER): Payer: Self-pay | Admitting: Rheumatology

## 2016-12-18 VITALS — BP 115/63 | HR 74 | Temp 97.2°F | Wt 125.3 lb

## 2016-12-18 DIAGNOSIS — Z6823 Body mass index (BMI) 23.0-23.9, adult: Secondary | ICD-10-CM

## 2016-12-18 DIAGNOSIS — R768 Other specified abnormal immunological findings in serum: Secondary | ICD-10-CM

## 2016-12-18 DIAGNOSIS — K089 Disorder of teeth and supporting structures, unspecified: Secondary | ICD-10-CM

## 2016-12-18 DIAGNOSIS — H04123 Dry eye syndrome of bilateral lacrimal glands: Secondary | ICD-10-CM

## 2016-12-18 NOTE — Patient Instructions (Signed)
The eye test that we did today was not consistent with Sjogren's syndrome. The labs looked great - everything was normal. I put in the referral to oral medicine to see if they can figure out the specific reason for the loss of your teeth/the problem with your jaw. Come back to see me in about 8 months and we will check it and see if your symptoms have evolved.       La prueba de ojo que hicimos hoy no fue consistente con el sndrome de Sjogren. Los laboratorios se vean geniales, todo era normal. Puse la referencia a medicina oral para ver si pueden descubrir la razn especfica de la prdida de sus dientes / el problema con su mandbula. Vuelve a verme en aproximadamente 8 meses y lo revisaremos para ver si tus sntomas han evolucionado.

## 2016-12-18 NOTE — Progress Notes (Signed)
[facility] Medicine - [facility] [place] [address]   TEL: [phone] l FAX: [phone]        PROBLEM LIST/PMH:  Patient Active Problem List    Diagnosis Date Noted   . Impaired functional mobility, balance, and endurance 10/03/2016   . Multiple sclerosis ([place]) 09/18/2016   . Chronic migraine without aura without status migrainosus, not intractable 09/18/2016   . Vertigo 09/18/2016       PRIMARY CARE PROVIDER:  Frutoso Schatz, MD  [facility] [address] [place] [address]    CONSULTING PROVIDER:  [place]  [address]  [place] [address]    PATIENT: Debra Torres    [id number]    IDENTIFYING DATA/CC:    Debra Torres is a 56 year old female seen today for follow-up of positive ANA screen (negative by IFA), Anti SSA/Ro antibody, and dry eyes, question Sjogren's syndrome. She was interviewed with the assistance of a Spanish telephonic interpreter.    HPI:  Debra Torres is a 56 year old female with possible MS; given this diagnosis several years ago (2004) while living in [place]. MRI brain with multiple lesions but had negative spinal cord MRI, LP and normal visual evoked potentials. Treated with injectable medications from around 2004-2014. Has had intermittent symptoms which she describes as "crises."     Currently, the diagnosis of MS is less clear. Moved to [place] to live with her daughter and was seen recently in the [place] at [facility] by Dr. Lorrin Mais - symptoms may be consistent with MS - but prior negative MRI of the spinal cord, negative LP and normal visual evoked potentials makes the diagnosis less clear.  MRI brain 10/2016 showed notable lesion burden (some pericallosal, juxtacortical and subcortical lesions, 1 punctate infratentorial lesion) which was stable since 2015. However on the MRI spine, one focal cord hyperintensity seen on axial images but not  on sagittal sequences (stable from prior 2015 scan but outside report reported no lesions).    Debra Torres reports that her "crises" last 3-4 days, can't get up, speak, walk. Cannot move her mandible so cannot eat. Has pain in the chest and has to force herself to breathe. Takes another week to recover.    Between episodes not quite completely normal; stumbles, has trouble holding her cup. In the morning, feels short of breath; has to concentrate on regulating her breathing. Subsequently (after a few minutes), her breathing is normal. Has a little trouble going up stairs and after walking for a long time (100 meters). Not sure if this is related to her deconditioning or a primary respiratory issue.  Had an episode of vertigo recently but recovered with physical therapy.     Has muscular pain in the arms and legs when she overdoes it. When she does regular activities, such as going to the grocery store, she feels this pain. Feels better with rest.  Endorses a general feeling of weakness/being debilitated. Has some pain in the back (lumbar spine, where she had the lumbar puncture in the past). Does not radiate. Feels it after activity (for instance, after sweeping the floor). No stiffness, just pain. Denies other joint pain. No rashes. Has dry eyes. Was given some lubricating drops. Uses them at night before she lays down to rest. No problem with tear production, cries a  lot when watching a sad movie, etc. Also has eye redness, blurry vision. No dry mouth. Started losing her teeth in 2013; diagnosed with a problem in the gums, possibly infection. No history of parotitis or salivary gland inflammation. No ulcers in the mouth or the genital area. No photosensitivity. No problems chest pain apart from the crises. As above, some breathing difficulty for a few minutes in the morning and after exerting herself, which improves with her breathing exercises. Attributes this to MS.  No fevers or night sweats. No weight loss. No  headaches. Also has a pain in her skull; feels dented in the back. Feels like it is getting larger. Had some hair loss in the past, not currently.       Interval history:  I saw Debra Torres last week, as above. She returns today to review lab results and for Schirmer testing.     Regarding lab results from last visit, CRP and ESR were normal, RF was negative, C3 and C4 were normal, CK was normal at 74, and SPEP with immunofixation was without hypergammaglobulinemia or a monoclonal component.    Debra Torres reports that she wants to add additional information today - she forgot to say that when she went to the dentist, she had an Xray and was told that her bone was being pulverized; was not given a more specific reason why this might be happening. Has had access to dental care during her life. Around 2010 noticed that teeth were "exposed" and she was starting to lose them. At this time was told that this was due to a bacteria.   She still has some of her bottom teeth and a few on top. Clarifies again that her mouth is not dry.      ROS:  Comprehensive review of systems completed, and reported in the HPI. Otherwise negative.     PAST MEDICAL HISTORY:  Possible MS  Vertigo  (migraines per neuro note but patient denies a history of headaches today)    PAST SURGICAL HISTORY:  Hysterectomy 1987  cholecystectomy 2014    Current Medications:  Current Outpatient Prescriptions   Medication Sig Dispense Refill   . Celecoxib (CELEBREX OR)      . Cholecalciferol (VITAMIN D) 2000 units Oral Cap Take 2,000 Units by mouth.     . Ibuprofen (ADVIL OR)      . Nortriptyline HCl 25 MG Oral Cap Take 31m (1 cap) at night x1 week, then 542m(2 cap) at night 60 capsule 6     No current facility-administered medications for this visit.        Allergies:  Review of patient's allergies indicates:  Allergies   Allergen Reactions   . Morphine    . Penicillins    . Tramadol        Family History:  No history of multiple sclerosis or other autoimmune  disorders.   Sister had uterine cancer  Grandmother had "cancer in the intestines"  Mom and dad are in decent health. Mom has heart problems.      Social History:  Social History     Social History   . Marital status: Divorced     Spouse name: N/A   . Number of children: N/A   . Years of education: N/A     Occupational History   . Not on file.     Social History Main Topics   . Smoking status: Never Smoker   . Smokeless tobacco: Never Used   . Alcohol  use Not on file   . Drug use: Unknown   . Sexual activity: Not on file     Other Topics Concern   . Not on file     Social History Narrative    Spanish speaking. Used to work in a [place], currently unable to work due to health problems. Moved from [place] to [place] with her daughter.      No ETOH or cigarettes. Living with her daughter. Hasn't worked for the past year. 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     PHYSICAL EXAM:  BP 115/63   Pulse 74   Temp 97.2 F (36.2 C) (Temporal)   Wt 125 lb 4.8 oz (56.8 kg)   SpO2 99% Comment: RA  BMI 23.68 kg/m   General: Comfortable appearing. No apparent distress.  Eyes: No conjunctival irritation or injection.  Pterygium left eye.   Oropharynx: Lips, mucosa, and tongue normal. Normal salivary pooling.  Upper denture.  Multiple missing teeth on the bottom row. Severe gum recession.  Lungs:  Easy work of breathing on room air.    Heart: warm and well perfused   Neuro: no focal deficits   Skin: no rashes on exposed skin  Psych:  Appropriate affect    Schirmer testing 12/18/16:  Left: 15m  Right: 124m   Labs:   Results for orders placed or performed in visit on 12/13/16   COMPLEMENT C4   Result Value Ref Range    Complement C4 43 13 - 50 mg/dL   COMPLEMENT C3   Result Value Ref Range    Complement C3 157 87 - 200 mg/dL   SED RATE   Result Value Ref Range    Erythrocyte Sedimentation Rate 10 0 - 20 mm/h   CRP, HIGH SENSITIVITY   Result Value Ref Range    C_Reactive Protein 1.6 0.0 - 10.0  mg/L   PROTEIN ELECTR. REFLX PNL   Result Value Ref Range    Protein (Total) 7.6 6.0 - 8.2 g/dL    Albumin 5.3 (H) 3.5 - 4.9 g/dL    Alpha 1 0.1 0.1 - 0.3 g/dL    Alpha 2 0.6 0.3 - 0.8 g/dL    Beta 0.7 0.6 - 1.0 g/dL    Gamma 0.9 0.4 - 1.4 g/dL    Electrophoresis Interp: (A) NORPAT     Based on accompanying immunofixation results, there is no monoclonal component present in this sample.   IMMUNOFIXATION   Result Value Ref Range    Immunofixation No monoclonal component observed. [facility] RHEUMATOID FACTOR   Result Value Ref Range    Rheumatoid Factor <10 <15 [IU]/mL   CREATINE KINASE TOTAL ACTIVITY   Result Value Ref Range    Creatine Kinase Total Activity 74 43 - 274 U/L         Imaging:   MRI Brain with/without contrast and T/C spine with contrast 11/12/2016  1. As compared to 11/26/2013, multiple grossly unchanged foci of white matter signal abnormality within the brain, accounting for differences in technique. Note that findings are relatively nonspecific and could be seen in setting of chronic ischemic vascular disease or demyelinating disease. No abnormal enhancement detected. No PML.    2. Stable focal T2 prolongation involving the thoracic spine at T2-T3 seen only on axial imaging and not well correlated on sagittal images. No new lesion or abnormal cord enhancement detected.       Impression and Recommendations:   Debra GaMarlowe Sax  Gillis Torres is a 56 year old female seen today for follow-up of positive ANA screen (negative by IFA), Anti SSA/Ro antibody, and dry eyes, question Sjogren's syndrome.     #Positive antiSSA antibody (+ANA screen, negative by IFA)  #Possible MS  #Dry eyes  #Poor dentition  Patients with Sjogren's syndrome typically have dry eyes and dry mouth plus some objective signs of severe dryness (positive Schirmer test or positive ocular surface staining, reduced unstimulated whole salivary flow, suggestive parotid/salivary scintigraphy, or imaging (typically [place] or MRI) showing characteristic findings in  the salivary glands, plus either positive autoantibodies (+antiSSA or +antiSSB or +ANA by IFA, typically with a centromere pattern, or +ANA>/=1:320 with a positive RF) or a positive lip biopsy with a  focal lymphocytic sialadenitis with focus score ?1. Patients with Sjogren's also often have an elevated ESR (due to hypergammaglobulinemia, so SPEP is often abnormal as well).    Debra Torres does not have a clear diagnosis of Sjogren's syndrome at this time, but I hesitate to rule out the possibility completely given that there remains uncertainly around her neurologic symptoms and imaging, as well as her dental history. Factors potentially arguing in favor of the diagnosis of Sjogren's syndrome include her positive antiSSA antibody, potentially her intermittent neurologic symptoms, and dental disease (though she denies dry mouth).  She has dry eyes and uses artificial tears at night but they are not clinically very severe and  Schirmer's testing was normal today. RF, ESR, and SPEP were all normal. Although there remains a lot of uncertainly surrounding CNS disease in Sjogren's patients, it has been reported to mimic MS (including relapsing remitting MS) and can present years before the patient clinically meets criteria for Sjogren's (Delalande et al 2004).     --as above, schirmer testing was normal today  --referral to oral medicine - perhaps they can clarify what might have caused her to lose over half of her teeth by the age of 89 when she has had access to dental care. If dry mouth is possibly a factor, could obtain ultrasound or MRI of the salivary gland to evaluate further for evidence of Sjogren's.  --will touch base with Dr. Lorrin Mais    Followup: 8 months or sooner if needed to evaluate evolution of symptoms             Jenna L. Terrial Rhodes MD, MPH  [address] [place] [address]  Pager:   [phone]  FAX:    [phone]  Email:    [email or web address]        Reference:  Delalande S, de Seze Lenna Sciara,  Fauchais AL, Chipley E, Stojkovic T, Ferriby D,  Dubucquoi S, Pruvo JP, Vermersch P, Hatron PY. Neurologic manifestations in  primary Sjgren syndrome: a study of 82 patients. Medicine [facility]). 2004  Sep;83(5):280-91. PubMed PMID: [id number].

## 2017-01-14 ENCOUNTER — Encounter (HOSPITAL_BASED_OUTPATIENT_CLINIC_OR_DEPARTMENT_OTHER): Payer: Self-pay | Admitting: Clinical Psychologist

## 2017-01-20 ENCOUNTER — Encounter (HOSPITAL_BASED_OUTPATIENT_CLINIC_OR_DEPARTMENT_OTHER): Payer: Self-pay

## 2017-03-14 ENCOUNTER — Encounter (HOSPITAL_BASED_OUTPATIENT_CLINIC_OR_DEPARTMENT_OTHER): Payer: Self-pay | Admitting: Neurology

## 2017-03-14 ENCOUNTER — Ambulatory Visit: Payer: Self-pay | Attending: Neurology | Admitting: Neurology

## 2017-03-14 VITALS — BP 127/68 | HR 67 | Temp 98.7°F | Ht 63.0 in | Wt 126.0 lb

## 2017-03-14 DIAGNOSIS — G43709 Chronic migraine without aura, not intractable, without status migrainosus: Secondary | ICD-10-CM

## 2017-03-14 DIAGNOSIS — N3941 Urge incontinence: Secondary | ICD-10-CM

## 2017-03-14 DIAGNOSIS — G35 Multiple sclerosis: Secondary | ICD-10-CM

## 2017-03-14 DIAGNOSIS — Z6822 Body mass index (BMI) 22.0-22.9, adult: Secondary | ICD-10-CM

## 2017-03-14 DIAGNOSIS — R42 Dizziness and giddiness: Secondary | ICD-10-CM

## 2017-03-14 DIAGNOSIS — Z7409 Other reduced mobility: Secondary | ICD-10-CM

## 2017-03-14 DIAGNOSIS — R5382 Chronic fatigue, unspecified: Secondary | ICD-10-CM

## 2017-03-14 DIAGNOSIS — N319 Neuromuscular dysfunction of bladder, unspecified: Secondary | ICD-10-CM

## 2017-03-14 LAB — URINALYSIS WITH REFLEX CULTURE
Bacteria, URN: NONE SEEN
Bilirubin (Qual), URN: NEGATIVE
Epith Cells_Renal/Trans,URN: NEGATIVE /HPF
Glucose Qual, URN: NEGATIVE mg/dL
Ketones, URN: NEGATIVE mg/dL
Leukocyte Esterase, URN: POSITIVE — AB
Nitrite, URN: NEGATIVE
Occult Blood, URN: NEGATIVE
Protein (Alb Semiquant), URN: NEGATIVE mg/dL
RBC, URN: NEGATIVE /HPF
Specific Gravity, URN: 1.012 g/mL (ref 1.002–1.027)
WBC, URN: NEGATIVE /HPF
pH, URN: 7 (ref 5.0–8.0)

## 2017-03-14 LAB — PR MEAS POST-VOIDING RESIDUAL URINE&/BLADDER CAP

## 2017-03-14 LAB — REFLEX CULTURE FOR UA

## 2017-03-14 MED ORDER — AMANTADINE HCL 100 MG OR CAPS
100.0000 mg | ORAL_CAPSULE | Freq: Two times a day (BID) | ORAL | 6 refills | Status: DC
Start: 2017-03-14 — End: 2017-06-20

## 2017-03-14 NOTE — Patient Instructions (Addendum)
-   Do not take Celebrex, Tylenol or Ibuprofen more than 2 days a week  - Urology: [phone]  - Try Amantadine for fatigue in AM and midday  - Please contact our clinic with any new or worsening neurological problems lasting more than 24 hours  - Return to see me in 3-6 months, sooner if needed

## 2017-03-14 NOTE — Progress Notes (Signed)
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 headaches. Happened frequently before starting to Betaseron, less while on the drug.         Had weakness and inability to breath on IV steroids (?) before  Imaging:     MRI brain 01/18: As compared to 11/26/2013, multiple grossly unchanged foci of white matter signal abnormality within the brain, accounting for differences in technique. Relatively nonspecific and could be seen in setting of chronic ischemic vascular disease or demyelinating disease. No abnormal enhancement detected. No PML.     MRI brain 02/15: Stable white matter lesions periventricular and pericallosal.    MRI C spine 01/18: No cervical cord lesions. Stable focal T2 prolongation involving the thoracic spine at T2-T3 seen only on axial imaging and not well correlated on sagittal images. No new lesion or abnormal cord enhancement detected.     MRI C and T spine 04/15: No spinal cord lesions. Hyperintense lesion lower pole right kidney. C4-5 disc protrusion, effaces ventral thecal sac but does not deform the cord    Interval History:   - Last visit with me was 11/12/16  - Seen with phone interpreter (who at time struggles to translate e.g. states "leakage from vagina", but really it was "leakage from bladder")    "I am worried", feels like her symptoms are getting worse:     - Last 2 months: leakage from bladder has become more, wears pads every day, urinary urgency.   - Had bladder  PT in the past for this, which was not very helpful.   - Bowel: no constipation, no leakage    - Weakness is getting worse: left arm and leg esp, has more problems carrying bag to [place] lessons  - Saw PT at [facility] for vertigo. No falls or problems with balance. Only 1x vertigo last 3 months.   - Referred to Rehab Psychology at [facility] after discussion with PT, has not seen them yet    - Last week has been "feeling very sleepy" in the back of her head. Wakes up at 5am but at 7am, feels like her brain is falling asleep, dyscomfort in the brain. Never had that sensation before.   - Not lack of energy but "brain falling asleep"    - Feels like her "flesh shakes", all body. Started 10 days ago. Had this before in the past.     - Started Nortriptyline at last visit, has not had headaches since then.   - Takes Celebrex if she has pain in the back of her head. Takes every 8-10 days.     - Had weakness and inability to breath on IV steroids (?) before for 1 week. Felt like heart was "having a hard time beating"    ROS:   Fatigue: Quite a bit  Sleep: A little bit  Double vision: Very much  Blurry vision: Very much  Swallowing problems: A little bit  Dizziness / light headedness:  Quite a bit  Numbness or tingling or odd sensations: A little bit  Pain: Not at all  Weakness: A little bit  Falling: Not at all  Spasms or jerking: Not at all  Tightness or stiffness: A little bit  Bladder problems: Very much  Bowel problems: Quite a bit  Depression: Not at all  Anxiety: Not at all  Problems thinking: A little bit  Heat sensitivity: Not at all  Skin problems (e.g., injection site reaction): A little bit  Heart palpitations: Quite a bit  Shortness of breath: A little bit    Physical Exam:   BP 127/68    Pulse 67    Temp 98.7 F (37.1 C) (Temporal)    Ht 5\' 3"  (1.6 m)    Wt 126 lb (57.2 kg)    BMI 22.32 kg/m      9ft Walk Exam  Time (sec): 5.94  Mobility: No Mobility Aids    General: in no acute distress    Eyes: no scleral  icterus or pallor    Respiratory: breathing comfortably on room air    Cardiovascular: no lower extremity edema      Neurologic Exam:     Mental Status: alert & oriented, during today's encounter normal attention, concentration, language, calm and appropriate in contact and mood balanced   Cranial Nerves: appropriate pupillary responses, appropriate visual fields, EOMI, no nystagmus, no dysconjugate gaze, no INO, face symmetrical, no facial paresis, facial sensation previously decreased left face to light touch and cold (not repeated today), hearing intact to finger rub, speech fluent, no dysarthria, palate elevates symmetrically, sternocleidomastoids strong and tongue midline without fasciculations   Motor:    Tone: normal.     Atrophy/Fasiculations: not observed     Pronator Drift: none.     Strength: 5/5 in all muscle groups of the upper and lower extremities   Reflexes: deferred  Coordination: finger to nose with no dysmetria, fine finger movements and foot tapping intact   Gait and Balance: gait within exam room intact, can walk on heels and toes  Sensory: decreased sensation left arm to light touch    Assessment and Plan:  [name] is a 56 year old woman prior diagnosis of multiple sclerosis, currently on no DMT, presenting for re-evaluation.     1. Disease modifying therapy: Reviewed today's MRI which show extensive periventricular and subcortical non-enhancing white matter lesions and non-enhancing cord lesion at T2. MRI brain is unchanged to 2017. Discussed option of starting safe DMT (glatiramer may be best option; patient also tolerated Betaseron well per her recollection, though I am somewhat concerned about increase in headaches on interferons). However, given her stable imaging off DMT (for 8 years) and lack of recent relapses (though limited historian), it also seems reasonable to hold off on DMT.   - No DMT at this time per patient preference  - Continue Vitamin D  - Not using tobacco  -  Patient to contact our clinic with any new or worsening neurological problems lasting more than 24 hours    2. Symptom management:   - Vertigo: Improved with Physical Therapy at [facility]   - Bladder problems: Today she reports urge incontinence, which may be due to prior childbirth and s/p hysterectomy. Bladder scan today normal. Will send UA/culture to assess for bladder infection. Referral to Urology.   - Fatigue: TSH normal in 07/2015. CBC within normal limits. Trial of Amandine 100mg  po in AM and midday, discussed risks and side effects.  3. Positive anti-SSA: Appreciate Rheumatology assessment    4. Migraines: Headaches are improved.  - Again discussed risk of rebound/overuse headache and urged her to limit use of Celebrex or Advil etc.   - Continue Nortriptyline 50mg  po at night      Return to see me in 3-6 months, sooner if needed    Histories, medications and problem list have been reviewed and updated as appropriate.   More than 50% of this encounter was spent in counseling and coordination of patient care with plan outlined above. Face-to-face time with this patient was 40 minutes.

## 2017-03-14 NOTE — Progress Notes (Signed)
Voided 200 ml, residual per US 0 ml. report given to Dr. Von Geldern, Gloria MD.

## 2017-03-16 LAB — URINE C/S: Culture: 8500

## 2017-03-17 ENCOUNTER — Encounter (HOSPITAL_BASED_OUTPATIENT_CLINIC_OR_DEPARTMENT_OTHER): Payer: Self-pay | Admitting: Neurology

## 2017-04-18 ENCOUNTER — Encounter (INDEPENDENT_AMBULATORY_CARE_PROVIDER_SITE_OTHER): Payer: Self-pay | Admitting: Obstetrics/Gynecology

## 2017-04-25 ENCOUNTER — Encounter (INDEPENDENT_AMBULATORY_CARE_PROVIDER_SITE_OTHER): Payer: Self-pay | Admitting: Obstetrics/Gynecology

## 2017-05-02 ENCOUNTER — Ambulatory Visit: Payer: Self-pay | Attending: Neurology | Admitting: Obstetrics/Gynecology

## 2017-05-02 ENCOUNTER — Encounter (INDEPENDENT_AMBULATORY_CARE_PROVIDER_SITE_OTHER): Payer: Self-pay | Admitting: Obstetrics/Gynecology

## 2017-05-02 VITALS — BP 117/64 | HR 66 | Wt 127.0 lb

## 2017-05-02 DIAGNOSIS — Z6822 Body mass index (BMI) 22.0-22.9, adult: Secondary | ICD-10-CM

## 2017-05-02 DIAGNOSIS — N3941 Urge incontinence: Secondary | ICD-10-CM

## 2017-05-02 MED ORDER — TOLTERODINE TARTRATE ER 4 MG OR CP24
4.0000 mg | EXTENDED_RELEASE_CAPSULE | Freq: Every day | ORAL | 1 refills | Status: DC
Start: 2017-05-02 — End: 2017-05-08

## 2017-05-05 ENCOUNTER — Other Ambulatory Visit (INDEPENDENT_AMBULATORY_CARE_PROVIDER_SITE_OTHER): Payer: Self-pay | Admitting: Obstetrics/Gynecology

## 2017-05-05 DIAGNOSIS — N3941 Urge incontinence: Secondary | ICD-10-CM

## 2017-05-05 NOTE — Telephone Encounter (Addendum)
Patient's pharmacy reports that patient does not have insurance coverage for prescriptions and a 30 day supply would cost the patient $212.00.     Please consider an alternative - possibly the immediate release formulation??

## 2017-05-08 MED ORDER — TOLTERODINE TARTRATE 2 MG OR TABS
2.0000 mg | ORAL_TABLET | Freq: Two times a day (BID) | ORAL | 6 refills | Status: DC
Start: 2017-05-08 — End: 2017-06-20

## 2017-05-08 NOTE — Progress Notes (Signed)
UROGYNECOLOGY NEW PATIENT EVALUATION    ID/CC: [name] is a 56 year old P3 with MS and possible Sjogren syndrome who presents withchief complaint of mixed (urge >> stress) incontinence.    PCP: Mariea Stable, MD  Referring MD: Harmon Dun, MD     HPI:  Debra Torres was diagnosed with RRMS in 2004 which was initially rapidly progressive but then came under better control with medication. Since then, she reports it has been only slowly progressive. She is not currently taking disease-modifying therapy.     She first noticed issues with urinary incontinence in 2014. The onset of these symptoms did not correlate with an MS flare.For the past few months she has been having to change pads more often (2-3x/day).She was referred to Urogynecology by her neurologist.     No hx of UTIs.    Bladder dysfunction/Urinary Incontinence:  Voids per day: 10-12  Voids per night: 2, is awakened by urge to urinate  Self-reported severity: 3/5  Leak with cough, sneeze, laugh: YES, a tablespoon.  Leak with urge to urinate: YES, leaks many tablespoons on way to the bathroom, on the floor of the bathroom, on the toilet. This bothers her more than the stress incontinence sx.  Typically leaks 2-3 times per day (wears a pad which she changes each time).  Treatment for the problem has included: Avoiding coffee since she notices more frequent urination after coffee. Denies hx of pelvic or "bladder" PT.  Blood in urine: NO  Lose urine while sleeping: NO  Any sense of incomplete emptying: NO     Dietary irritants/Intake:  0 cups of coffee per day  1cups of GREEN tea/day  2 cups of juice/day (one for lunch, one for dinner)  1 cup colas EVERY 3-4 DAYS  1/2-1 BOTTLEof Water per day     Vaginal Bulge:  Description: NONE  Bulge or something felt outside the vagina: NO  Needs to splint (Push tissue) to void: NO  Needs to splint (Push tissue) to stool: NO  Bleeding from the vagina: NO, none since hysterectomy in 1987  Interference  with sexual activity: NO, not sexually active    Bowel/Stooling:  BMs3-4 times per week (every other day)  Consistency of typical stool: normal/soft  Need to strain to empty: NO  Completely empty: NO  Accidental loss: NO  Wexner score: 0/20  Pain with BM: NO  Strong/bothersome urgency to defecate: NO  See patient intake questionnaire for additional data.    Patient Active Problem List    Diagnosis Date Noted   . Chronic fatigue [R53.82] 03/14/2017   . Urgency incontinence [N39.41] 03/14/2017   . Impaired functional mobility, balance, and endurance [Z74.09] 10/03/2016   . Multiple sclerosis (HCC) [G35] 09/18/2016   . Chronic migraine without aura without status migrainosus, not intractable [G43.709] 09/18/2016   . Vertigo [R42] 09/18/2016     Past Medical History:   Diagnosis Date   . Headache    . MS (multiple sclerosis) (HCC)      SURGICAL HISTORY:  1985 removal of R ovaryand 1/2 of L ovary for right ovarian cyst, did NOT get any hormone replacement therapy  1987 hysterectomy for "tumor" which caused her stomach to protrude and caused heavy bleeding, did not have any other treatment  2013 laproscopic cholecystectomy    Social History     Social History   . Marital status: Divorced     Spouse name: N/A   . Number of children: N/A   .  Years of education: N/A     Occupational History   . Not on file.     Social History Main Topics   . Smoking status: Never Smoker   . Smokeless tobacco: Never Used   . Alcohol use No   . Drug use: No   . Sexual activity: No     Other Topics Concern   . Not on file     Social History Narrative    Spanish speaking. 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 Moved from [place] to [place] with her daughter.      Current Outpatient Prescriptions   Medication Sig Dispense Refill   . Amantadine HCl 100 MG Oral Cap Take 1 capsule (100 mg) by mouth 2 times a day. Take in AM and midday. Do NOT take at night. 60 capsule 6   . Celecoxib (CELEBREX OR)      .  Cholecalciferol (VITAMIN D) 2000 units Oral Cap Take 2,000 Units by mouth.     . Ibuprofen (ADVIL OR)      . Nortriptyline HCl 25 MG Oral Cap Take 25mg  (1 cap) at night x1 week, then 50mg  (2 cap) at night (Patient not taking: Reported on 05/02/2017) 60 capsule 6   . Tolterodine Tartrate 2 MG Oral Tab Take 1 tablet (2 mg) by mouth 2 times a day. 60 tablet 6     No current facility-administered medications for this visit.      Review of patient's allergies indicates:  Allergies   Allergen Reactions   . Morphine    . Penicillins    . Tramadol        Family History        Negative family history of: Multiple Sclerosis      Family history of breast cancer:NO, ovarian cancer: NO, uterine cancer: NO or colon cancer; NO  Urinary Incontinence: NO, Pelvic Organ Prolapse  NO    OB HISTORY:  [id number], one miscarriage at ~14 weeks followed by NSVDs x3  Largest: 8 #.13 oz  NO forceps; NO vacuum deliveries, 3rd delivery required episiotomy.     GYN HISTORY:  Menses: menarche age 32  Menopause: S/p removal of R OV and half of L OV in 1985 and hyst in 1987, she has never experienced menopause sx and has never had hormone replacement Tx. She has hadNO vaginal bleeding since thehysterectomy.   PAP Hx: s/p hyst (total?)    Sexual Function:  Sexually active: NO    RROS:Extended 2-9, Complete 10+  Constitutional: Negative for fever, chills, weight loss.  Eyes: No recent blurry vision, difficulty seeing, or other vision change  Ears, Nose, Mouth, Throat: Negative for cough, rhinorrhea, sore throat  Cardiovascular: Negative for chest pain, positive for SOB on exertion (for past 2.5 yrs)  Respiratory: Negative for wheezing  Gastrointestinal: No abd pain, see HPI for additional ROS.  Genitourinary: See HPI.  Musculoskeletal: Negative for joint or muscle pain.  Skin: No rash or itching.  Neurological: Negative for tremors, numbness, recent vertigo  Psychiatric: Negative for feelings of depression  Endocrine: Negative for excessive thirst  orheat/cold intolerance.  Hematologic/Lymphatic: Negative for swollen glands, easy bruising.  Allergic/Immunologic: Negative for seasonal allergies     Complete ROS done and reviewed with patient on intake form to be scanned in- see  page 7    See intake form page 7/8 for sub-level detail.    PHYSICAL EXAM:  BP 117/64   Pulse 66  Wt 127 lb (57.6 kg)   SpO2 99%   BMI 22.50 kg/m   ZOX:WRUE nourished, well developed woman in NAD.  HEENT:Pterygium medial side of left eye. No conjunctival injection or scleral icterus.Neck:Midline trachea.  Resp:Breathing comfortably on room airwith no use of accessory muscles and normalrespiratory effort.  CV:No LE edema.  Neuro:A&O  Skin:No lesions or rashes in the areas observed  Psych:No undue anxiety or agitation during today's visit    ASSESSMENT AND PLAN:  [name] is a 56 year old P3 with chief complaint of mixed (urge >> stress) incontinence.    ASSESSMENT AND PLAN:  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      I explained the pathophysiology of urinary incontinence to [name] using a whiteboard and emphasizing the difference between urge and stress urinary incontinence.  I explained how her multiple sclerosis may be involved in her urgency symptoms but emphasized how the first line treatment options can help to manage the symptoms regardless of her MS.  I did explain that it's impossible to know exactly what her bladder is doing without more invasive testing however our preliminary assessment suggests that her bladder is emptying normally.  I explained the first line treatment options including diet changes, pelvic floor exercises and medication usage.  I explained the 3 general classes of medications and how they impact the bladder.  I reviewed side effects and why we would want to  choose certain medications over others.    RECOMMENDATIONS:   1) Diet changes - provided a list of bladder irritants in Spanish and advised to trial cutting them out for ~2 weeks to see if there is any improvement to her symptoms  2) Pelvic floor exercises - provided a handout in Spanish describing home pelvic floor exercises AND referred to pelvic PT.   Provided with a list of pelvic PT providers near her home.  3) Medications -4-6 week trial ofDetrol, sent to patient's pharmacy  4) NO hx of UTI and negative urinalysis in late May at time of referral (while sx were occurring), so will not do repeat urinalysis.  5)Pleasebe seen by yourprimary care provider to discuss SOB with exertion x 2.5 years.     Return in 4-6 weeks for the following:  1)Anyimprovement after diet change, home pelvic floor exercises and/or PT, and Rx? If not significantly improved, try another Rx.  2) Pelvic exam, evaluate for organ prolapse,urethral hypermobility,urethral or bladder anatomicabnormalities, and estrogenization of tissues.  3)BVI andPVRto help evaluate for neurogenic component of her incontinence    Orders Placed This Encounter   . REFERRAL TO PHYSICAL THERAPY     Referral Priority:   Routine     Referral Type:   Physical Therapy     Requested Specialty:   Physical Therapy Med     Number of Visits Requested:   99

## 2017-05-21 IMAGING — MG MM SCREENING BREAST TOMO BILATERAL
7 series · 9 of 19 positions shown · non-contrast
Comparison: Previous exam(s).

CLINICAL DATA: Screening.

EXAM:
2D DIGITAL SCREENING BILATERAL MAMMOGRAM WITH CAD AND ADJUNCT TOMO

[R MLO]
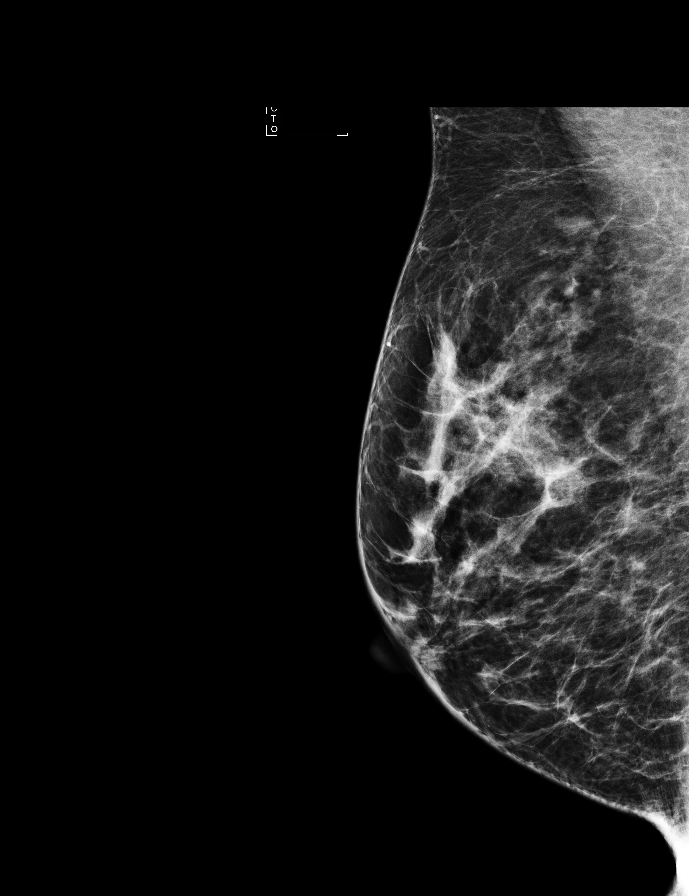

[R CC]
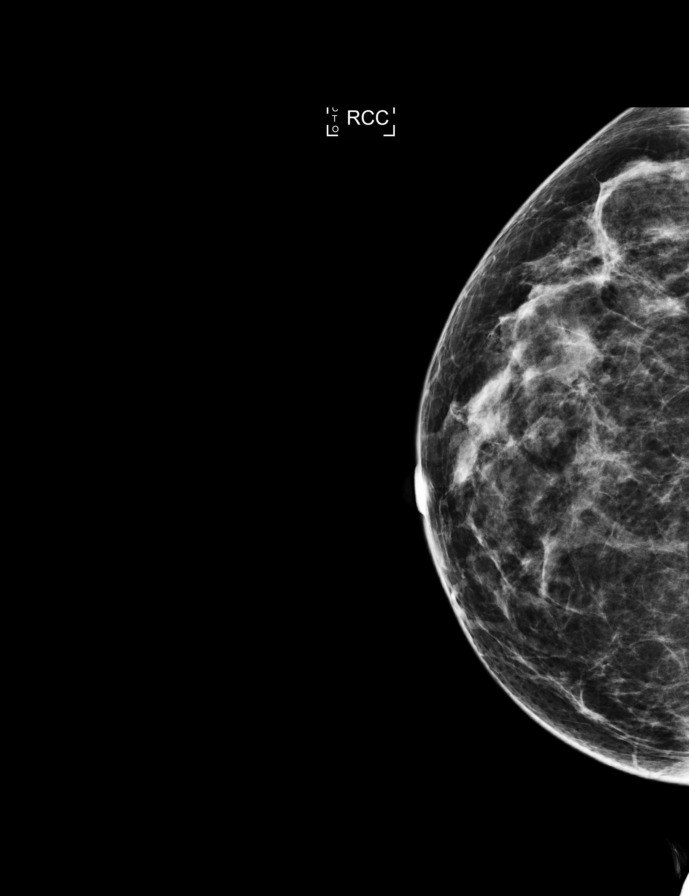

[L MLO]
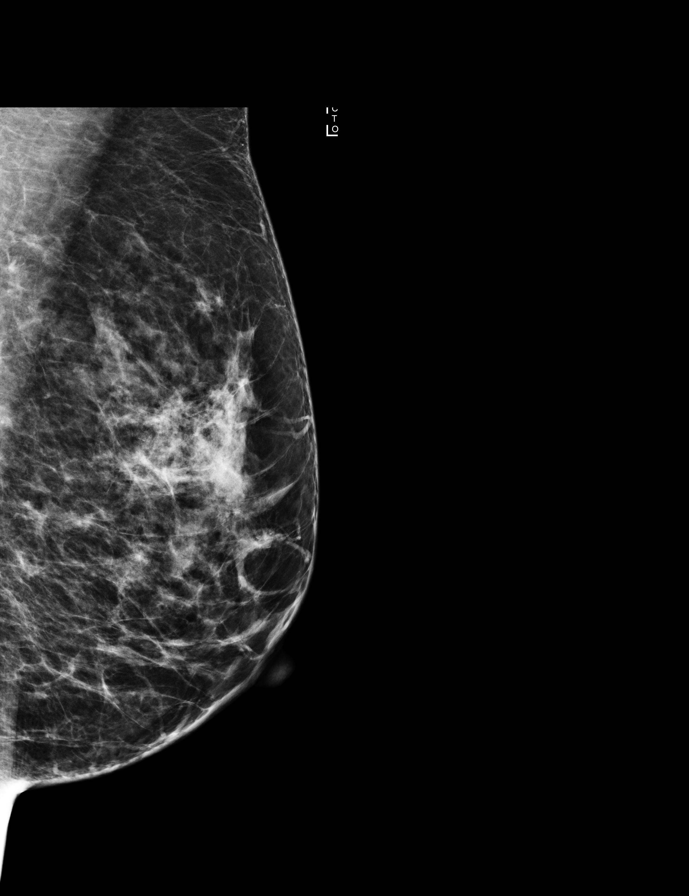

[L CC]
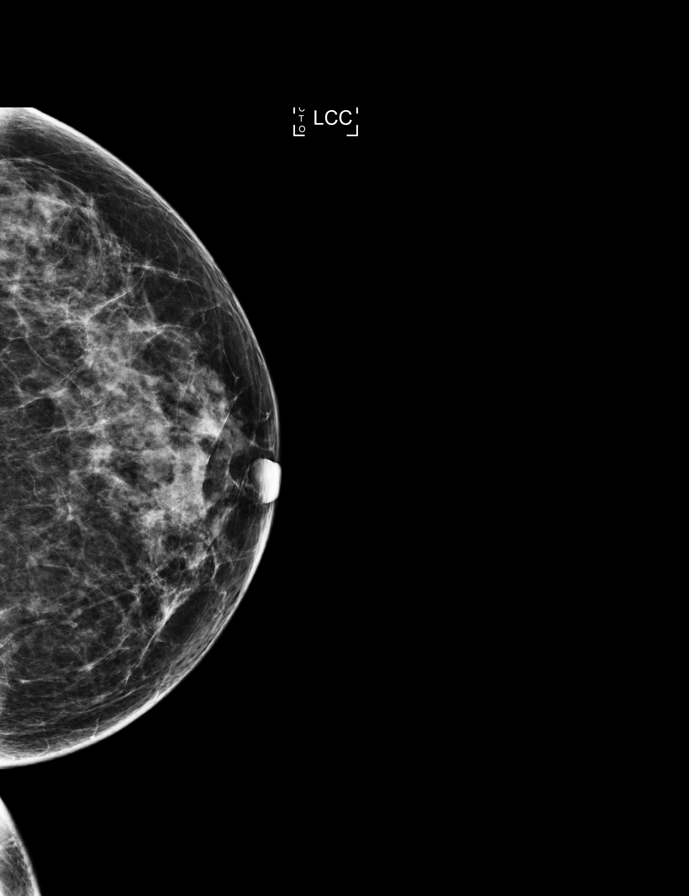

[R MLO tomo · 3 of 70 frames shown]
[frame 23/70]
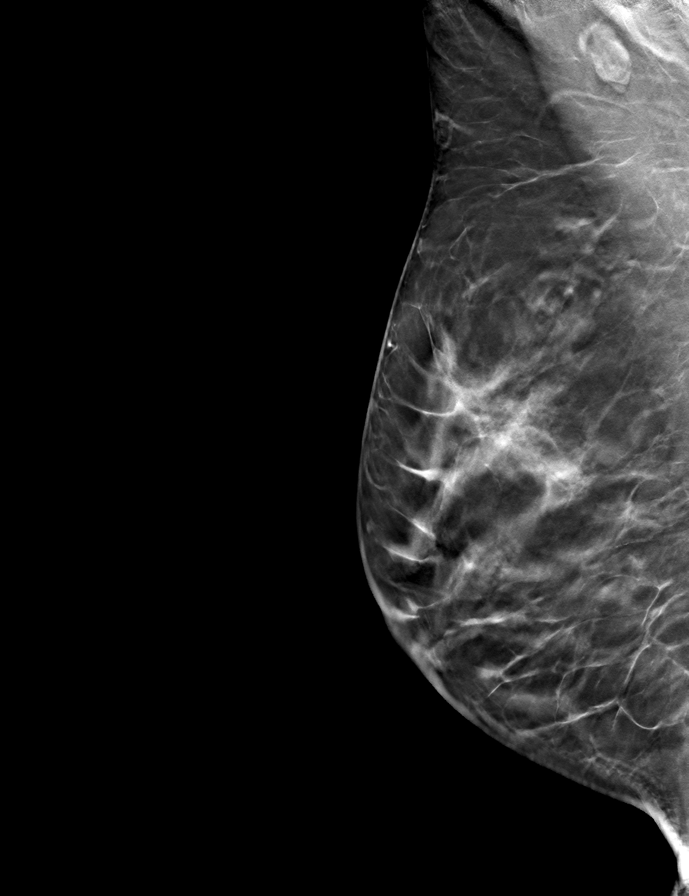
[frame 35/70]
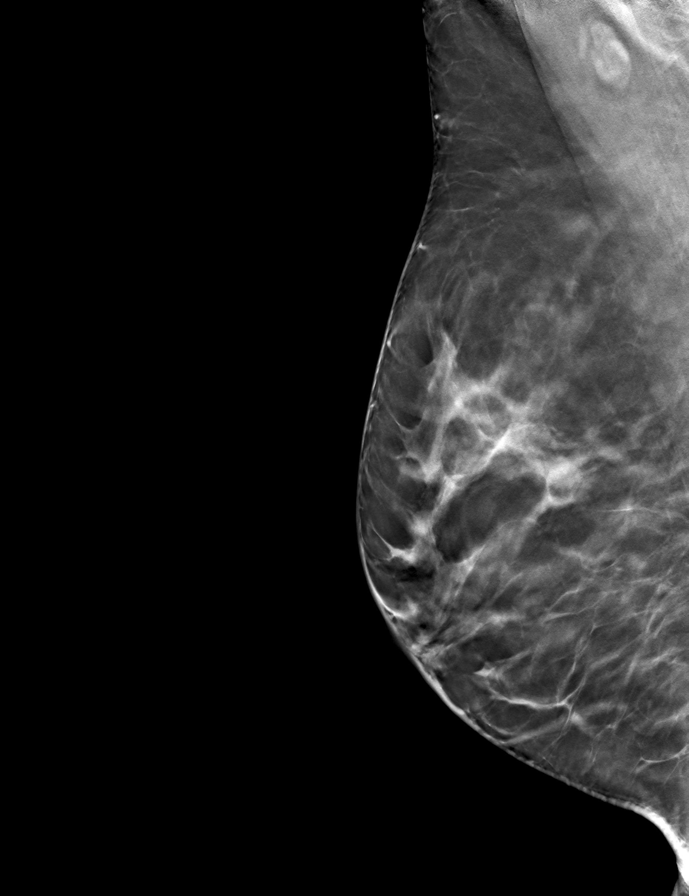
[frame 48/70]
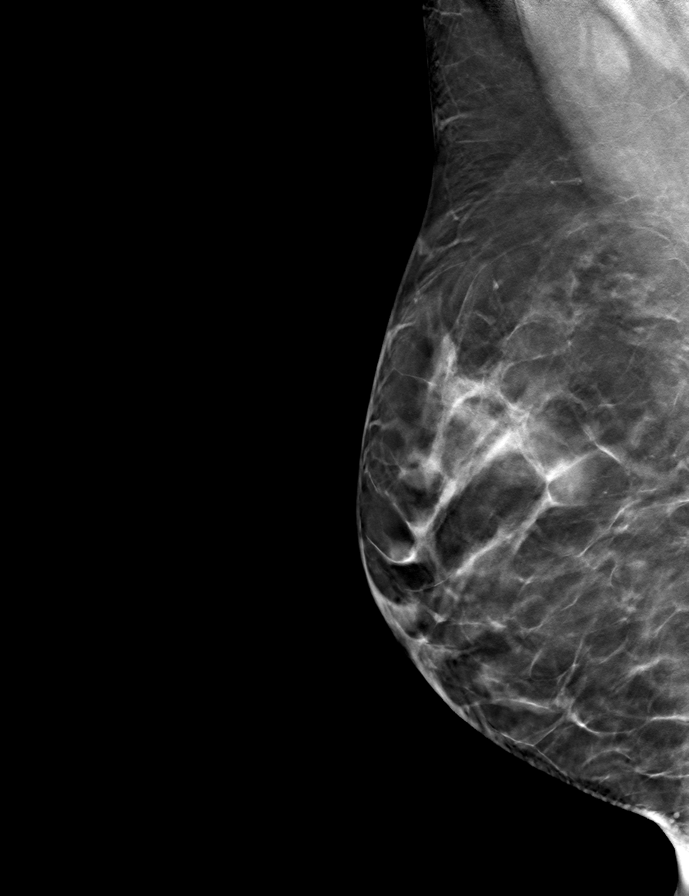

[L CC tomo · tomo slice 31/62.0]
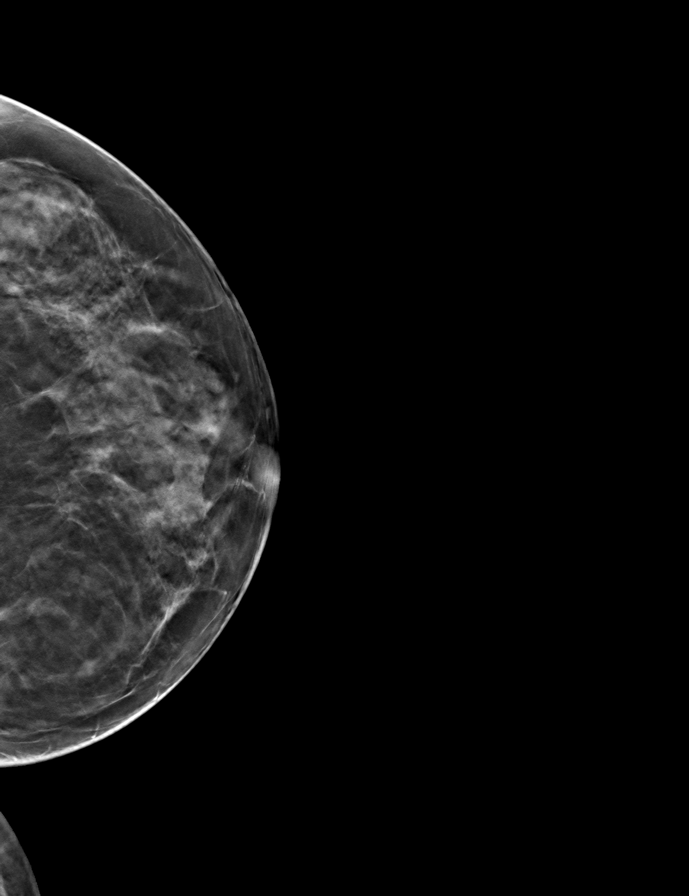

[L MLO tomo · tomo slice 31/62.0]
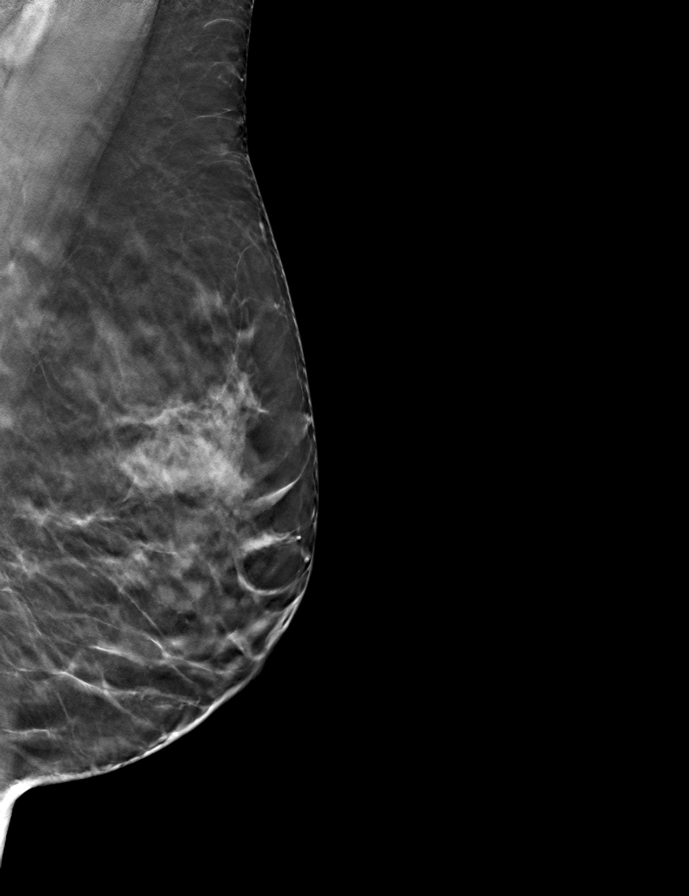

[9 of 19 positions shown; findings below may reference images not displayed]

ACR Breast Density Category c: The breast tissue is heterogeneously
dense, which may obscure small masses.
FINDINGS: There are no findings suspicious for malignancy. Images were
processed with CAD.
IMPRESSION: No mammographic evidence of malignancy. A result letter of this
screening mammogram will be mailed directly to the patient.

RECOMMENDATION:
Screening mammogram in one year. (Code:[id number])

BI-RADS CATEGORY  1: Negative.

## 2017-06-13 ENCOUNTER — Encounter (INDEPENDENT_AMBULATORY_CARE_PROVIDER_SITE_OTHER): Payer: Self-pay | Admitting: Obstetrics/Gynecology

## 2017-06-20 ENCOUNTER — Encounter (HOSPITAL_BASED_OUTPATIENT_CLINIC_OR_DEPARTMENT_OTHER): Payer: Self-pay | Admitting: Physical Medicine & Rehabilitation

## 2017-06-20 ENCOUNTER — Ambulatory Visit: Payer: Self-pay | Attending: Physical Medicine & Rehabilitation | Admitting: Neurology

## 2017-06-20 ENCOUNTER — Ambulatory Visit (HOSPITAL_BASED_OUTPATIENT_CLINIC_OR_DEPARTMENT_OTHER): Payer: Self-pay | Admitting: Physical Medicine & Rehabilitation

## 2017-06-20 VITALS — BP 121/74 | HR 67 | Temp 98.1°F | Ht 62.0 in | Wt 127.0 lb

## 2017-06-20 DIAGNOSIS — G35 Multiple sclerosis: Secondary | ICD-10-CM

## 2017-06-20 DIAGNOSIS — R42 Dizziness and giddiness: Secondary | ICD-10-CM

## 2017-06-20 DIAGNOSIS — Z6823 Body mass index (BMI) 23.0-23.9, adult: Secondary | ICD-10-CM

## 2017-06-20 DIAGNOSIS — R2689 Other abnormalities of gait and mobility: Secondary | ICD-10-CM

## 2017-06-20 DIAGNOSIS — N319 Neuromuscular dysfunction of bladder, unspecified: Secondary | ICD-10-CM

## 2017-06-20 DIAGNOSIS — M792 Neuralgia and neuritis, unspecified: Secondary | ICD-10-CM

## 2017-06-20 MED ORDER — VITAMIN D 50 MCG (2000 UT) OR CAPS
2000.0000 [IU] | ORAL_CAPSULE | Freq: Every day | ORAL | 3 refills | Status: AC
Start: 2017-06-20 — End: ?

## 2017-06-20 MED ORDER — OXYBUTYNIN CHLORIDE 5 MG OR TABS
5.0000 mg | ORAL_TABLET | Freq: Every day | ORAL | 11 refills | Status: DC
Start: 2017-06-20 — End: 2017-06-27

## 2017-06-20 MED ORDER — GABAPENTIN 100 MG OR CAPS
ORAL_CAPSULE | ORAL | 3 refills | Status: DC
Start: 2017-06-20 — End: 2019-06-25

## 2017-06-20 NOTE — Patient Instructions (Addendum)
-  gabapentin para el dolor de los nervios.  Toma 1 pastilla a noche.  Si no es mal, pueda tomar 2 pastillas o 3 pastillas a noche.  (Si tolera, puede tomar 1 pastilla mas en las manana--solo si quiere)    -refill vitamin D 2000 units por dia    -Physical Therapy-[place] [facility]  [phone]    -oxybutynin 5 mg en la manana para la vejiga.  puede causar boca seca    -la proxima cita en 3 meses

## 2017-06-20 NOTE — Progress Notes (Signed)
Physical Medicine and Rehabilitation Note    Chief Complaint: pain interfering with sleep, dizziness, bladder  Used phone interpreter.    Background neuro history (saw Dr. 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 headaches. Happened frequently before starting to Betaseron, less while on the drug.         Had weakness and inability to breath on IV steroids (?) before  Imaging:     MRI brain 01/18: As compared to 11/26/2013, multiple grossly unchanged foci of white matter signal abnormality within the brain, accounting for differences in technique. Relatively nonspecific and could be seen in setting of chronic ischemic vascular disease or demyelinating disease. No abnormal enhancement detected. No PML.     MRI brain 02/15: Stable white matter lesions periventricular and pericallosal.    MRI C spine 01/18: No cervical cord lesions. Stable focal T2 prolongation involving the thoracic spine at T2-T3 seen only on axial imaging and not well correlated on sagittal images. No new lesion or abnormal cord enhancement detected.     MRI C and T spine 04/15: No spinal cord lesions. Hyperintense lesion lower pole right kidney. C4-5 disc protrusion, effaces ventral thecal sac but does not deform the cord    Co-morbidities: positive anti-SSA    SH: Spanish speaking. Lives w daughter.    Rehab history:   -refill of vitamin D needed.  -reports has insurance now, but at times medicines still too expensive    -compared to 3 mo ago, a little worse in general, especially dizziness/pain.    -Mobility: L arm/leg weakness. does  lose balance esp crouching (at times falls forward/backwards), mostly catches self. In past vertigo improved with PT.   Now feels dizzy all the time. When doesn't sleep more dizzy.  -ADLS: may feel as if will drop glasses held in L hand. Doing okay with ADLs/IADLs but taking longer.  -Equipment: no AD.  -Speech/swallow/Respiratory: swallowing okay.  -Therapy: past PT at [facility] very helpful.  -Exercise:    -Fatigue: yes.  Never took amantadine-was afraid. Not sleeping well contributing.  -Sleep: interrupted by leg pains.  -Mood: has anxiety about her MS. was referred to rehab psychology in the past, don't see that she connected.    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  -Avocational pursuits:    -left side decreased temperature sense.  -Pain: chronic migraine. celebrex when pain severe. Otherwise takes ibuprofen brings pain from 7/10 to 3/10 (2 pills at night).  Sleeps 7 hours.  Muscle pains worse on left.   Past: nortriptyline (made her feel bad, felt short of breath)  -Spasticity: muscle pains more frequent, all over. All the time. Esp back and shin.  Feels legs are trembling inside, but not moving.  Not cramps per se.    -Skin:    -Bowel: fine.  -Bladder: no dysuria. Urinary urgency, goes once every 2 hours. urinary incontinence.   Wakes up once at night to urinate. No UTIs.   4 years since using pads, 1 year with more serious issue, large gush of  urine comes out. Never difficult to empty.  Saw urogyn Dr. Murvin Natal states that she was told that he didn't specialize in MS bladder  Past: tolterodine 2 mg bid (was too expensive, didn't take)  03/14/17 voided 200 mL, PVR 0 mL.  -gyn: removal of R ovary and half of L ovary for R ovarian cyst. 1987 hysterectomy.    -Vision:      Review of Systems  49ft Walk Exam  Mobility: No Mobility Aids    Symptoms  Fatigue: A little bit  Sleep: Not at all  Double vision: Not at all  Blurry vision: Quite a bit  Swallowing  problems: Not at all  Dizziness / light headedness: A little bit  Numbness or tingling or odd sensations: A little bit  Pain: Quite a bit  Weakness: Quite a bit  Falling: Not at all  Spasms or jerking: Quite a bit  Tightness or stiffness: A little bit  Bladder problems: Not at all  Bowel problems: Not at all  Sexual problems:  (Not applicable per patient)  Depression: Not at all  Anxiety: Not at all  Problems thinking: A little bit  Heat sensitivity: Not at all  Skin problems (e.g., injection site reaction): Not at all  Heart palpitations: Quite a bit  Shortness of breath: Quite a bit    Current Outpatient Prescriptions:     Amantadine HCl 100 MG Oral Cap, Take 1 capsule (100 mg) by mouth 2 times a day. Take in AM and midday. Do NOT take at night. (Patient not taking: Reported on 06/20/2017), Disp: 60 capsule, Rfl: 6    Celecoxib (CELEBREX OR), , Disp: , Rfl:     Cholecalciferol (VITAMIN D) 2000 units Oral Cap, Take 1 capsule (2,000 Units) by mouth daily., Disp: 90 capsule, Rfl: 3    Gabapentin 100 MG Oral Cap, Take 1-3 capsules at night and 1 capsule in the morning., Disp: 360 capsule, Rfl: 3    Ibuprofen (ADVIL OR), , Disp: , Rfl:     Oxybutynin Chloride 5 MG Oral Tab, Take 1 tablet (5 mg) by mouth daily., Disp: 30 tablet, Rfl: 11    Review of patient's allergies indicates:  Allergies   Allergen Reactions    Morphine     Penicillins     Tramadol        Past Medical History:   Diagnosis Date    Headache     MS (multiple sclerosis) (HCC)        No past surgical history on file.    Social History     Social History    Marital status: Divorced     Spouse name: N/A    Number of children: N/A    Years of education: N/A     Social History Main Topics    Smoking status: Never Smoker    Smokeless tobacco: Never Used    Alcohol use No    Drug use: No    Sexual activity: No     Other Topics Concern    Not on file     Social History Narrative    Spanish speaking. 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 Moved from [place] to [place] with her daughter.        Family History        Negative family history of: Multiple Sclerosis          Physical exam  Vitals:    06/20/17 1044  BP: 121/74   BP Cuff Size: Regular   BP Site: Left Arm   BP Position: Sitting   Pulse: 67   Temp: 98.1 F (36.7 C)   TempSrc: Temporal   Weight: 127 lb (57.6 kg)   Height: 5\' 2"  (1.575 m)     Constit: NAD, seated in office chair.  Eyes: conjunctiva clear. Few beats of end nystagmus looking to L. Ocular testing caused dizziness.  ENMT: speech clear  Resp: regular respirations, no acc muscle use  Mental status: alert, answers questions appropriately.    MSK:  -tender points positive throughout bilateral UEs, upper back, chest.    Neuro:  -light touch: decreased in LUE and LLE  -tone: major joints easily ranged x 4 extremities except L heel cord is tight  -gait: tentative without assistive device  -tandem gait: a lot of effort but was able to walk exam room distance. Large amplitude trunk deviations.  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  -she calls it muscle pain, but notes it is worse on the left side than the right (and there is L>R sensory impairment). In addition though she feels trembling in legs at night, it is an internal sensation rather than actually jumping of the legs. Thus I believe this is more of a neuropathic phenomenon than a spasticity phenomenon.  -unfortunately she had to come off the nortriptyline due to it worsened her shortness of breath (got better when she stopped it)    Multiple  sclerosis  -was told previously she should be on vitamin D 2000 units daily, but she ran out of refills. Vitamin D may be helpful in course of MS. Vitamin D refill was ordered and sent to [facility] to check for prescription coverage. Asked patient to update her insurance card at the front desk.    Vertigo, impaired balance  -she notes this is once again a significant problem. Bothers her frequently. In past PT really helped.  -re-referred to PT at [facility]    Neurogenic bladder due to MS  -sounds quite consistent with overactive bladder without evidence of retention  -urologist gave her tolterodine but it was $200.  -thus gave her oxybutynin 5 mg IR form to try. She knows it can cause dry mouth. She already has dry mouth, but willing to try anyway    Follow up 3 months    Histories, medications and problem list have been reviewed and updated as appropriate: YES.    60 min face-to-face time spent, >50% in counseling and care coordination. Please see above assessment and plan for topics addressed. Plan above was discussed in detail with patient.

## 2017-06-20 NOTE — Patient Instructions (Addendum)
-   Agree with starting Gabapentin for the nerve pain (and headaches)  - Please try not to take Advil or Celebrex more than 3 days a week  - Plan repeat MRI in January/February: [phone]   - Please contact our clinic with any new or worsening neurological problems lasting more than 24 hours  - Return to see Dr Aileen Fass in 3 months and me in 6 months after MRI, sooner if needed

## 2017-06-20 NOTE — Progress Notes (Signed)
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 headaches. Happened frequently before starting to Betaseron, less while on the drug.         Had weakness and inability to breath on IV steroids (?) before  Imaging:     MRI brain 01/18: As compared to 11/26/2013, multiple grossly unchanged foci of white matter signal abnormality within the brain, accounting for differences in technique. Relatively nonspecific and could be seen in setting of chronic ischemic vascular disease or demyelinating disease. No abnormal enhancement detected. No PML.     MRI brain 02/15: Stable white matter lesions periventricular and pericallosal.    MRI C spine 01/18: No cervical cord lesions. Stable focal T2 prolongation involving the thoracic spine at T2-T3 seen only on axial imaging and not well correlated on sagittal images. No new lesion or abnormal cord enhancement detected.     MRI C and T spine 04/15: No spinal cord lesions. Hyperintense lesion lower pole right kidney. C4-5 disc protrusion, effaces ventral thecal sac but does not deform the cord    Interval History:   - Last visit was 03/14/17    - Her most bothersome symptoms right now are urinary leakage and dizziness    - Since then has been been by Urogyn for her urge incontinence and recommended diet changes, pelvic floor exercises (referred to pelvic PT) and 4-6 wk trial of Detrol. She does not think that the medicine is helping because she has MS and she has  been trying the exercise but has not seen the pelvic floor PT yet. She continues to have some urinary leakage but has not had bowel accidents. She has a urology appt 06/27/2017    Fatigue - Did not end up up trying an amantadine trial because it was too expensive. She continues to feel tired but she feels like she is beginning to feel like she can manage it and she has also felt like her church and faith ahs been helpding.     HA - She tried nortrptaline once but said that "since I have MS it went right to my brain and made me feel terrible".  She continues to take Celebrex for both HA and body aches, taking it about 3x/wk.      - She continues to have whole body pain,  Including in her legs, especially the calves and upper  Arms and back.  She feels like it is an aching pain, but also sometimes feels like it is "frozen",  Particularly in the back.  The pain did not change when she started taking nortryptaline.  She takes advil everynight before going to bed to help her sleep.  Takes 2 capsules every night.   - Dr Aileen Fass prescribed Gabapentin trial this am    - Still feels like she is weaker on the left side than the right.  Feels about the same as last time,  Still has some trouble carrying heavy bags but is able  to walk ok for short distances. First thing in the morning is the worst, takes a few steps before she is able to walk normally.   - Has previously been referred to Rehab psych,  Has not seen    - Feels dizzy all of the time, was previously seen by [facility] PT for vertigo with improvement.  She used up all her sessions but has a new referral back to see them. She has not had any falls. She still feels like stress tends to make the dizziness worse.    - Dr Aileen Fass -rereferred her this am    ROS:   Fatigue: A little bit  Sleep: Not at all  Double vision: Not at all  Blurry vision: Quite a bit  Swallowing problems: Not at all  Dizziness / light headedness: A little bit  Numbness or tingling or odd sensations: A little  bit  Pain: Quite a bit  Weakness: Quite a bit  Falling: Not at all  Spasms or jerking: Quite a bit  Tightness or stiffness: A little bit  Bladder problems: Not at all  Bowel problems: Not at all  Sexual problems:  (Not applicable per patient)  Depression: Not at all  Anxiety: Not at all  Problems thinking: A little bit  Heat sensitivity: Not at all  Skin problems (e.g., injection site reaction): Not at all  Heart palpitations: Quite a bit  Shortness of breath: Quite a bit    Physical Exam:   BP 121/74    Pulse 67    Temp 98.1 F (36.7 C)    Ht 5\' 2"  (1.575 m)    Wt 127 lb (57.6 kg)    BMI 23.23 kg/m     General: in no acute distress    Eyes: no scleral icterus or pallor    Respiratory: breathing comfortably on room air    Cardiovascular: no lower extremity edema      Neurologic Exam:     Mental Status: alert & oriented, during today's encounter normal attention, concentration, language, calm and appropriate in contact and mood balanced   Cranial Nerves: appropriate pupillary responses, appropriate visual fields, EOMI, no nystagmus, no dysconjugate gaze, no INO, face symmetrical, no facial paresis, facial sensation intact, hearing intact to finger rub, speech fluent, no dysarthria, palate elevates symmetrically, sternocleidomastoids strong and tongue midline without fasciculations . Vision 20/25 bilaterally.  No red desaturation  Motor:    Tone: normal.     Atrophy/Fasiculations: not observed     Pronator Drift: none.     Strength: 5/5 in all muscle groups of the upper and lower extremities except 4+/5 in R biceps   Reflexes: 2+ in biceps, triceps, achilles, patella.  Bilateral toes downgoing  Coordination: finger to nose with no dysmetria, tandem walk across room without stepping out. Negative romberg  Gait and Balance: gait within exam room intact, can walk on heels and toes  Sensory: decreased sensation left arm/left leg to light touch, sharp touch and temperature    Assessment and Plan:  Debra Torres is a  56 year old woman prior diagnosis of multiple sclerosis, currently on no DMT, presenting for re-evaluation who has been essentially stable since last evaluation 02/2017       1. Disease modifying therapy:Last MRI was 10/2016 which showed extensive periventricular and subcortical non-enhancing white matter lesions and non-enhancing cord lesion at T2. Given her  Continued stability off DMT (for 8 years) and lack of recent relapses (though limited historian), it also seems  reasonable to continue not being on DMT   - No DMT  - Continue Vitamin D  - Patient to contact our clinic with any new or worsening neurological problems lasting more than 24 hours    2. Symptom management:   - Vertigo: Improved with Physical Therapy at [facility], re-referred today  - Bladder problems: Continue to follow with Urology   - Fatigue: Improved with increased church particpation,  Continue  - Left sided subjective weakness : PT at [place]  - Whole boy pain:  Gapapentin trial though Dr. Aileen Fass     3. Positive anti-SSA: Appreciate Rheumatology assessment     4. Migraines:   - Gabapentin trial may help with headaches and support sleep,  Which will certainly help with headaches  - Again discussed risk of rebound/overuse headache and urged her to limit use of Celebrex or Advil etc to no mare than 3x/wk maximum    Return to see Dr Elenor Legato in 6 months with repeat MRI, sooner if needed    Histories, medications and problem list have been reviewed and updated as appropriate.   The patient was seen with Dr. Elenor Legato, attending.

## 2017-06-21 ENCOUNTER — Encounter (HOSPITAL_BASED_OUTPATIENT_CLINIC_OR_DEPARTMENT_OTHER): Payer: Self-pay | Admitting: Neurology

## 2017-06-21 NOTE — Progress Notes (Signed)
I personally saw and evaluated the patient. I discussed the patient with Dr. Elvina Sidle. I agree with the findings and plan as documented in the note above. I spent 25 minutes with the patient; greater than 50% of time was spent in counseling and coordination of care.    In Summary,     [name] is a 56 year old woman prior diagnosis of multiple sclerosis and chronic daily headaches, currently on no DMT, presenting for re-evaluation.     - Agree with starting Gabapentin for neuropathic pain and headaches. If not tolerated or effective, could trial Venlafaxine or Duloxetine. Did not tolerate Nortritpyline.   - Extensively counseled patient not to take Advil or Celebrex more than 2-3 days a week and again explained rebound headaches  - Plan repeat MRI in January/February prior to next visit  - Patient to contact our clinic with any new or worsening neurological problems lasting more than 24 hours    Return to see Dr Aileen Fass in 3 months and me in 6 months after MRI, sooner if needed

## 2017-06-26 NOTE — Progress Notes (Signed)
UROGYNECOLOGY FOLLOW-UP VISIT    ID/CC/HPI:  Debra Torres is a 56 year old who I have been managing for urge predominant mixed incontinence.  After initial visit discussed the following plan:  1) Diet changes   2) Pelvic floor exercises  3) Medications -4-6 week trial ofDetrol, sent to patient's pharmacy      RELEVANT HISTORY SUMMARY:  Debra Torres was diagnosed with RRMS in 2004 which was initially rapidly progressive but then came under better control with medication. Since then, she reports it has been only slowly progressive. She is not currently taking disease-modifying therapy.    She first noticed issues with urinary incontinence in 2014. The onset of these symptoms did not correlate with an MS flare.For the past few months she has been having to change pads more often (2-3x/day).  Voids per night: 2, is awakened by urge to urinate  Self-reported severity: 3/5  Leak with cough, sneeze, laugh: YES, a tablespoon.  Leak with urge to urinate: YES, leaks many tablespoons on way to the bathroom, on the floor of the bathroom, on the toilet.   This bothers her more than the stress incontinence sx.  Typically leaks 2-3 times per day (wears a pad which she changes each time).  Treatment for the problem has included: Avoiding coffee since she notices more frequent urination after coffee.     INT HX (PF/EPF: 1-3; D/C: 4+):   Trying to incorporate her exercises.  Unfortunately the Oxybutynin I wrote for was 200$ so she did not fill it.  Reports the pharmacist recommended a different med - trying to determine what they suggested.  No UTI symptoms - Burning, odor, blood. No new bladder symptoms. Has a cold but otherwise doing the same.    PELVIC FLOOR TREATMENT HISTORY:  Detrol  PT     SOCIAL HISTORY UPDATE: (D: 1; C: 2-3):  None    ROS (EPF: 1; D: 2-9; C: 10+):   Constitutional: Positive for cough, cold symptoms   Cardiovascular: Negative    Respiratory: Negative    Gastrointestinal: Negative      Patient Active  Problem List    Diagnosis Date Noted    Chronic fatigue [R53.82] 03/14/2017    Urgency incontinence [N39.41] 03/14/2017    Impaired functional mobility, balance, and endurance [Z74.09] 10/03/2016    Multiple sclerosis (HCC) [G35] 09/18/2016    Chronic migraine without aura without status migrainosus, not intractable [G43.709] 09/18/2016    Vertigo [R42] 09/18/2016     No past surgical history on file.  Outpatient Prescriptions Marked as Taking for the 06/27/17 encounter (Office Visit) with Nathaneil Canary, MD   Medication Sig Dispense Refill    Celecoxib (CELEBREX OR)       Cholecalciferol (VITAMIN D) 2000 units Oral Cap Take 1 capsule (2,000 Units) by mouth daily. 90 capsule 3    Gabapentin 100 MG Oral Cap Take 1-3 capsules at night and 1 capsule in the morning. 360 capsule 3    Ibuprofen (ADVIL OR)        Current Outpatient Prescriptions   Medication Sig Dispense Refill    Celecoxib (CELEBREX OR)       Cholecalciferol (VITAMIN D) 2000 units Oral Cap Take 1 capsule (2,000 Units) by mouth daily. 90 capsule 3    Gabapentin 100 MG Oral Cap Take 1-3 capsules at night and 1 capsule in the morning. 360 capsule 3    Ibuprofen (ADVIL OR)       Oxybutynin Chloride 5 MG Oral Tab Take  1 tablet (5 mg) by mouth 3 times a day as needed. 60 tablet 6     No current facility-administered medications for this visit.      Review of patient's allergies indicates:  Allergies   Allergen Reactions    Morphine     Penicillins     Tramadol      Exam: (PF: 1 area; EPF: 2-7; D: 2-7/4+; C: 8 related systems)   Vitals: BP 110/62    Pulse 76    Wt 127 lb (57.6 kg)    SpO2 98%    BMI 23.23 kg/m   General:  Alert and oriented and in NAD and in a pleasant mood - wearing mask.    Assessment/Plan: Debra Torres is a 56 year old with Mixed incontinence - urge predominant.  PFEs working but struggling to incorporate.  Rx for Oxybutynin based on recc of her pharmacist in [facility].       Visit Diagnoses:   (N39.41)  Urge incontinence  (primary encounter diagnosis)     Orders Placed This Encounter    Oxybutynin Chloride 5 MG Oral Tab     Sig: Take 1 tablet (5 mg) by mouth 3 times a day as needed.     Dispense:  60 tablet     Refill:  6

## 2017-06-27 ENCOUNTER — Ambulatory Visit: Payer: Self-pay | Admitting: Obstetrics/Gynecology

## 2017-06-27 ENCOUNTER — Encounter (INDEPENDENT_AMBULATORY_CARE_PROVIDER_SITE_OTHER): Payer: Self-pay | Admitting: Obstetrics/Gynecology

## 2017-06-27 ENCOUNTER — Telehealth (INDEPENDENT_AMBULATORY_CARE_PROVIDER_SITE_OTHER): Payer: Self-pay

## 2017-06-27 VITALS — BP 110/62 | HR 76 | Wt 127.0 lb

## 2017-06-27 DIAGNOSIS — Z6823 Body mass index (BMI) 23.0-23.9, adult: Secondary | ICD-10-CM

## 2017-06-27 DIAGNOSIS — N3941 Urge incontinence: Secondary | ICD-10-CM

## 2017-06-27 MED ORDER — OXYBUTYNIN CHLORIDE 5 MG OR TABS
5.0000 mg | ORAL_TABLET | Freq: Three times a day (TID) | ORAL | 6 refills | Status: DC | PRN
Start: 2017-06-27 — End: 2017-08-08

## 2017-06-27 NOTE — Telephone Encounter (Signed)
Used Licensed conveyancer - pt had [place] talk to her daughter as she was feeling ill. Let her know we called [facility] and ordered the  New med bringing cost down from $212 to $37, in addition she should use that prescription savings card that I handed her during the office visit and it should bring the cost down even further. Let her know to call if she has further questions.

## 2017-07-11 ENCOUNTER — Ambulatory Visit (HOSPITAL_BASED_OUTPATIENT_CLINIC_OR_DEPARTMENT_OTHER)
Payer: Self-pay | Attending: Physical Medicine & Rehabilitation | Admitting: Rehabilitative and Restorative Service Providers"

## 2017-07-11 DIAGNOSIS — H814 Vertigo of central origin: Secondary | ICD-10-CM

## 2017-07-11 DIAGNOSIS — Z7409 Other reduced mobility: Secondary | ICD-10-CM

## 2017-07-11 DIAGNOSIS — G35 Multiple sclerosis: Secondary | ICD-10-CM

## 2017-07-11 NOTE — Progress Notes (Signed)
[facility] PHYSICAL THERAPY INITIAL PLAN OF CARE          L & I Claim #: N/A    Certification From*: 07/11/17  Certification To*: 08/10/17  ___ Discontinue Therapy Services    Date of Symptom Onset*: 06/20/17  Start of Care Date*: 07/11/17  VISITS FROM SOC: 1    Referring Provider: Dessa Phi, MD  Interpreter Status: Yes - In Person    Reason for Referral*: vestibular  History of Present Illness: 56 y/o woman with Relapsing Remitting MS and chronic dizziness referred to [facility] for vestibular evaluation   MRI 11/12/16  1. As compared to 11/26/2013, multiple grossly unchanged foci of white matter   signal abnormality within the brain, accounting for differences in technique.   No abnormal enhancement detected. No PML.    2. Stable focal T2 prolongation involving the thoracic spine at T2-T3. No new lesion or abnormal cord enhancement detected.    Pertinent Medical/Surgical History:   Past Medical History:   Diagnosis Date    Headache     MS (multiple sclerosis) (HCC)      Social History: Lives with daughter and her daughter's family (69 year old granddaughter). She enjoys spending time with granddaughter. Likes to do yoga for exercise. She is unemployed.    Current / Past Rehabilitation: [facility] 2017/2018  Prior Level of Function  Prior Function Comments*: Independent     Precautions:    Precautions: none        Fall Screening:  Are you afraid of falling?: Yes  Have you fallen in the past year?: No  Issues with walking/balance/feeling unsteady: Yes    SUBJECTIVE:  Patients Statement: Pr reports a sense of dizziness (spinning) and imbalance most of the time that worsens with stress and fast head movements. She reports some hearing loss in L ear and uses glasses for vision.     Pain Score: 7 (pain in thoracic and lumbar spine, feels numb and tingly )  Patient Stated Goals: To improve balance, decrease dizziness, get stronger    OBJECTIVE:  Vital signs: BP = 118/73, HR = 73     Range of Motion: WNLS c-spine    Strength: Grossly  4/5 throughout extremities, no localized weakness     Sensation: TBA    Coordination: UE RAMs WNLs, LE RAMs slightly slowed    Standing Balance:   Rhomberg EO = 30 sec, EC = 30 sec  Foam EO = 30 sec, Foam EC = 30 sec   Tandem = 30 sec  SLS = 10 sec B    Vestibular:    Smooth Pursuit: Impaired  Vergence: converges, NPC ~ 4", symptomatic ("makes me feel weak")  Saccades: WNLs  VOR slow: WNLs  Headthrust: 2-3 beats nystagmus each side     Bed Mobility: I     Transfers: I    Ambulation:    Distance: 200 ft   Level of Assist: I   Deviations: B Trendelenberg   10-meter speed: 9.26 sec  Functional Gait Assessment: 27/30   Gait Level surface: 3   Change in gait speed: 3   Gait with horizontal head turns: 3 (dizziness)   Gait with vertical head turns: 3   Gait and pivot turn: 3   Step over obstacle: 3   Gait with narrow base of support: 3   Gait with eyes closed: 1   Ambulating backwards: 2   Steps: 3  FGA cutoff score of 22/30 is effective in classifying fall risk in  older adults and predicting unexplained falls in community-dwelling older adults.    Stairs: Pt self selects to ascend/descend 4 stairs with single rail using reciprocal pattern mod I     Wheelchair Mobility: N/A    Equipment:  None    INTERVENTION:    Evaluation Code 16109 x 60 minutes    Non-billable Time  Supervised heat on low back at end of session while discussing plan    Education:    Primary learner: patient   Preferred learning style:  verbal  Barriers to learning: language   Cultural practices that influence care: none   Topic taught: PT goals and POC, recommended electric heating pad to manage back pain    Response to education: Pt stated understanding and agreeable to plan        ASSESSMENT: 56 y/o woman with MS and chronic dizziness presents with the following:  Comorbidities and Personal Factors affecting plan of care: MS, headaches  Impairments: chronic dizziness, decreased coordination LEs, generalized weakness, low back pain, impaired  oculomotor function   Activity Limitations: decreased balance  Participation Restrictions: limited recreation  Clinical Presentation for Selection of Evaluation Code: Evolving  Clinical Decision Making Complexity:Moderate  Pt will benefit from outpatient PT to address these impairments and activity limitations to meet goals below. Pt's dizziness appears to be central in nature likely due to RRMS as seen by positive central signs of + smooth pursuits, impaired vergence, and nystagmus with head thrust. Her back pain also appears to be neuropathic in nature. She would benefit from PT for vestibular habituation training, high level balance training, and provision of HEP for generalized strengthening and conditioning.     Fall risk based on assessment: moderate     Rehabilitation potential is: Good    Potential Barriers to achieve rehab goals: MS, headaches, central vestibular dysfunction     Care Connections Functional Index Score: Outcome Score: 22 (assessed for when pt is in a "crisis" or flare-up of her MS)    GOALS:    Discharge Goals:  1. Pt will be I with comprehensive HEP to improve strength, ROM and balance to maintain gains made in physical therapy.  2. Pt will demonstrate improved functional gait and decreased fall risk as seen by improvement on FGA to 30/30 points.        Current Level of Function Monthly Goals     07/11/2017 To be reviewed by:  08/10/2017   Pt currently doing yoga at home   Assess yoga program, provide suggestions and provide additional exercise for balance, strength, and conditioning    Pt with motion sensitivity  Assess MSQ       The above goals and plan of care have been discussed and agreed upon by the patient .    PLAN:   Patient will be seen for 1x/week for approximately 6 total visits.      Planned interventions: Therapeutic Activities, Therapeutic Exercise, Neuromuscular Re-education, Gait Training    Plan for next visit:  Look at pt's yoga program    Anticipated discharge date:  December 2018    Sharol Roussel, PT, NCS  Physical Therapist   [facility])  Department of Rehabilitation Medicine

## 2017-07-18 ENCOUNTER — Ambulatory Visit (HOSPITAL_BASED_OUTPATIENT_CLINIC_OR_DEPARTMENT_OTHER): Payer: Self-pay | Admitting: Rehabilitative and Restorative Service Providers"

## 2017-07-18 DIAGNOSIS — G35 Multiple sclerosis: Secondary | ICD-10-CM

## 2017-07-18 DIAGNOSIS — Z7409 Other reduced mobility: Secondary | ICD-10-CM

## 2017-07-18 DIAGNOSIS — H814 Vertigo of central origin: Secondary | ICD-10-CM

## 2017-07-18 NOTE — Progress Notes (Signed)
[facility] PHYSICAL THERAPY TREATMENT NOTE  L&I Claim Number:  N/A    Certification From*: 07/11/17  Certification To*: 08/10/17    Visits from [facility]: 2  Referring Provider: Dessa Phi, MD      Interpreter Status: Yes - In Person    SUBJECTIVE: Pt reports that hotpack was too expensive, but that she has been using hot towels and her back pain has improved. She had a fall in the shower when standing on one leg to clean the bottom of her opposite foot. She didn't hit her head, but does have some bruises on her arms and back.     OBJECTIVE/INTERVENTIONS:    Intervention 1- Therapeutic Exercise  x 30 Minutes:   - Pt completed 12 min yoga video on Youtube with cues for core activation  - LE stretching: Double knee to chest, supine hamstring stretch, piriformis figure 4 stretch with heat on back    Education:    Primary learner: patient   Preferred learning style:  verbal  Barriers to learning: language   Cultural practices that influence care: none   Topic taught: Continue with Yoga and gradually increase time from 12 min to 30 min, walk for endurance, provided handout for LE stretches, consider safety in shower (make sure someone is at home, bring cell phone into bathroom, use grab bar, or sit in bath)    Response to education: Pt stated understanding       ASSESSMENT/PATIENT RESPONSE TO THERAPY:  Pt's yoga video was excellent addressing strength, flexibility, core, and medication. Recommend that she continue with this and gradually increase to 30 min on most days. She was provided with LE stretching program and heat to help manage her back pain.     PLAN:   Continue intervention per Plan of Care.     Plan for next visit: balance, MSQ?    Sharol Roussel, PT, NCS  Physical Therapist   [facility] [phone])  Department of Rehabilitation Medicine

## 2017-07-25 ENCOUNTER — Ambulatory Visit (HOSPITAL_BASED_OUTPATIENT_CLINIC_OR_DEPARTMENT_OTHER)
Payer: Self-pay | Attending: Physical Medicine & Rehabilitation | Admitting: Rehabilitative and Restorative Service Providers"

## 2017-07-25 DIAGNOSIS — Z7409 Other reduced mobility: Secondary | ICD-10-CM

## 2017-07-25 DIAGNOSIS — G35 Multiple sclerosis: Secondary | ICD-10-CM

## 2017-07-25 DIAGNOSIS — H814 Vertigo of central origin: Secondary | ICD-10-CM

## 2017-07-25 NOTE — Progress Notes (Signed)
[facility] PHYSICAL THERAPY TREATMENT NOTE  L&I Claim Number:  N/A    Certification From*: 07/11/17  Certification To*: 08/10/17    Visits from [facility]: 3  Referring Provider: Dessa Phi, MD      Interpreter Status: Yes - In Person    SUBJECTIVE: Pt reports that her back is feeling a lot better since she has been using heat and stretching regularly. She continues to do yoga for ~ 15 min/day. She hasn't had any more falls and has been more cautious in the shower    OBJECTIVE/INTERVENTIONS:    Intervention 1- Therapeutic Exercise  x 30 Minutes:   - TM x 10 min, 1.6 mph starting with UE support and progressing to arms at sides, SBA-CGA  - Travelling lunges x 20 ft with hands on hips  - LE stretches with hotpack on back    Education:    Primary learner: patient   Preferred learning style:  verbal  Barriers to learning: language   Cultural practices that influence care: none   Topic taught: Look at what equipment is available at the gym in your building and take a pic, don't do TM alone   Response to education: Pt stated understanding       ASSESSMENT/PATIENT RESPONSE TO THERAPY:  Pt did well on TM until she stopped. She had significant dizziness/vertigo that took 5-10 min to resolve. Will try to slow TM down incrementally next time to see if that will minimize/resolve vertigo.     PLAN:   Continue intervention per Plan of Care.     Plan for next visit: LE strengthening, balance, try TM again    Sharol Roussel, PT, NCS  Physical Therapist   [facility])  Department of Rehabilitation Medicine

## 2017-08-07 NOTE — Progress Notes (Signed)
UROGYNECOLOGY FOLLOW-UP VISIT    ID/CC/HPI:  Debra Torres is a 56 year old who I have been managing for urge predominant mixed incontinence.  After initial and subsequent visits discussed the following plan:  1) Diet changes   2) Pelvic floor exercises  3) Medications -4-6 week trial ofDetrol, sent to patient's pharmacy  - Changed to BID-TID dosing at last visit (06/27/17)    RELEVANT HISTORY SUMMARY:  Debra Torres was diagnosed with RRMS in 2004 which was initially rapidly progressive but then came under better control with medication. Since then, she reports it has been only slowly progressive. She is not currently taking disease-modifying therapy.    She first noticed issues with urinary incontinence in 2014. The onset of these symptoms did not correlate with an MS flare.For the past few months she has been having to change pads more often (2-3x/day).  Voids per night: 2, is awakened by urge to urinate  Self-reported severity: 3/5  Leak with cough, sneeze, laugh: YES, a tablespoon.  Leak with urge to urinate: YES, leaks many tablespoons on way to the bathroom, on the floor of the bathroom, on the toilet.              This bothers her more than the stress incontinence sx.  Typically leaks 2-3 times per day (wears a pad which she changes each time).  Treatment for the problem has included: Avoiding coffee since she notices more frequent urination after coffee.    INT HX (PF/EPF: 1-3; D/C: 4+):   Medicine has not worked for her.  Has followed instructions.  No other changes although she recently had a fall in her shower (3 weeks ago).     PELVIC FLOOR TREATMENT HISTORY:  Detrol  PT  Oxybutynin     SOCIAL HISTORY UPDATE: (D: 1; C: 2-3):  No changes    ROS (EPF: 1; D: 2-9; C: 10+):   Constitutional: Negative    Cardiovascular: Negative    Respiratory: Negative    Gastrointestinal: Negative      Patient Active Problem List    Diagnosis Date Noted    Chronic fatigue [R53.82] 03/14/2017    Urgency incontinence  [N39.41] 03/14/2017    Impaired functional mobility, balance, and endurance [Z74.09] 10/03/2016    Multiple sclerosis (HCC) [G35] 09/18/2016    Chronic migraine without aura without status migrainosus, not intractable [G43.709] 09/18/2016    Vertigo [R42] 09/18/2016     No past surgical history on file.  Outpatient Prescriptions Marked as Taking for the 08/08/17 encounter (Office Visit) with Nathaneil CanaryFialkow, Deno Sida Frederick, MD   Medication Sig Dispense Refill    Celecoxib (CELEBREX OR)       Cholecalciferol (VITAMIN D) 2000 units Oral Cap Take 1 capsule (2,000 Units) by mouth daily. 90 capsule 3    Gabapentin 100 MG Oral Cap Take 1-3 capsules at night and 1 capsule in the morning. 360 capsule 3    Ibuprofen (ADVIL OR)        Current Outpatient Prescriptions   Medication Sig Dispense Refill    Celecoxib (CELEBREX OR)       Cholecalciferol (VITAMIN D) 2000 units Oral Cap Take 1 capsule (2,000 Units) by mouth daily. 90 capsule 3    Gabapentin 100 MG Oral Cap Take 1-3 capsules at night and 1 capsule in the morning. 360 capsule 3    Ibuprofen (ADVIL OR)       Solifenacin Succinate 5 MG Oral Tab Take 1 tablet (5 mg) by mouth daily. 30  tablet 1     No current facility-administered medications for this visit.      Review of patient's allergies indicates:  Allergies   Allergen Reactions    Morphine     Penicillins     Tramadol        Exam: (PF: 1 area; EPF: 2-7; D: 2-7/4+; C: 8 related systems)   Vitals: BP 113/59    Pulse 75    Wt 126 lb (57.2 kg)    SpO2 99%    BMI 23.05 kg/m   General:  Alert and oriented and in NAD and in a pleasant mood  Skin: Warm, dry and intact    Assessment/Plan: Debra Torres is a 55 year old with urge incontinence that has been refractory to first line options thus far.  Discussed typical treatment options and would prefer 2nd med option over further testing.  Understands if she fails this med attempt will proceed with testing and discussion of bladder Botox injection vs.  Neuromodulation.       Visit Diagnoses:   (N39.41) Urge incontinence  (primary encounter diagnosis)       Orders Placed This Encounter    Solifenacin Succinate 5 MG Oral Tab     Sig: Take 1 tablet (5 mg) by mouth daily.     Dispense:  30 tablet     Refill:  1       Medications Discontinued During This Encounter   Medication Reason    Oxybutynin Chloride 5 MG Oral Tab Dose adjustment       BILLING (2 of 3):  Level 2: HX: PF PE: PF  MDM: SF  Level 3: HX: EPF PE: EPF MDM: L  Level 4: HX: D  PE: D  MDM: M  Level 5: HX: C  PE: C  MDM: H

## 2017-08-08 ENCOUNTER — Ambulatory Visit: Payer: Self-pay | Admitting: Obstetrics/Gynecology

## 2017-08-08 ENCOUNTER — Encounter (INDEPENDENT_AMBULATORY_CARE_PROVIDER_SITE_OTHER): Payer: Self-pay | Admitting: Obstetrics/Gynecology

## 2017-08-08 VITALS — BP 113/59 | HR 75 | Wt 126.0 lb

## 2017-08-08 DIAGNOSIS — N3941 Urge incontinence: Secondary | ICD-10-CM

## 2017-08-08 DIAGNOSIS — Z6823 Body mass index (BMI) 23.0-23.9, adult: Secondary | ICD-10-CM

## 2017-08-08 MED ORDER — SOLIFENACIN SUCCINATE 5 MG OR TABS
5.0000 mg | ORAL_TABLET | Freq: Every day | ORAL | 1 refills | Status: DC
Start: 2017-08-08 — End: 2017-09-19

## 2017-08-15 ENCOUNTER — Encounter (HOSPITAL_BASED_OUTPATIENT_CLINIC_OR_DEPARTMENT_OTHER): Payer: Self-pay | Admitting: Rehabilitative and Restorative Service Providers"

## 2017-08-18 ENCOUNTER — Other Ambulatory Visit (INDEPENDENT_AMBULATORY_CARE_PROVIDER_SITE_OTHER): Payer: Self-pay | Admitting: Obstetrics/Gynecology

## 2017-08-18 DIAGNOSIS — N3941 Urge incontinence: Secondary | ICD-10-CM

## 2017-08-19 NOTE — Telephone Encounter (Signed)
This request falls outside refill center's protocols:     Vesicare alternative - per fax, pt requesting something cheaper as it unfortunately is not covered by her insurance. (no covered alternatives provided by pharmacy)    If this medication is denied please have your staff inform the patient and schedule an appointment if necessary

## 2017-08-22 ENCOUNTER — Ambulatory Visit (HOSPITAL_BASED_OUTPATIENT_CLINIC_OR_DEPARTMENT_OTHER): Payer: Self-pay | Attending: Neurology | Admitting: Rehabilitative and Restorative Service Providers"

## 2017-08-22 DIAGNOSIS — G35 Multiple sclerosis: Secondary | ICD-10-CM

## 2017-08-22 DIAGNOSIS — H814 Vertigo of central origin: Secondary | ICD-10-CM

## 2017-08-22 DIAGNOSIS — Z7409 Other reduced mobility: Secondary | ICD-10-CM

## 2017-08-22 NOTE — Telephone Encounter (Signed)
Pt does not have health insurance.    Pt tried oxybutynin 5mg  tid but didn't help. Also failed Tolterodine (Detrol) 2mg  bid. Vesicare would cost about $300 a month.    Called [facility] at [phone]. The least expensive rx is Hyoscyamine every 4 hours up to 1.5mg  daily. If she take this amount it will cost the same as the oxybutynin 5mg  tid.

## 2017-08-22 NOTE — Progress Notes (Signed)
[facility] PHYSICAL THERAPY TREATMENT NOTE  L&I Claim Number:  N/A    Certification From*: 08/22/17  Certification To*: 09/21/17    [facility]  Referring Provider: Dessa PhiGloria Hou, MD      Interpreter Status: Yes - In Person    SUBJECTIVE: Pt reports that she is doing better overall. She hasn't had any falls since last session, but does feel particularly off balance when she kneels or squats down to play with her granddaughter or do chores around the house. She has increased the amount of time she is doing yoga to 20-25 min, sometimes 2x/day and find that this helps with her stress. She had episode of forgetting where she was today when in the hospital elevator, and is concerned about her memory.     OBJECTIVE/INTERVENTIONS:    Intervention 1- Therapeutic Exercise x 40 Minutes:   - Nu-Step x 10 min level 4, monitoring HR/BP and symptoms  - LE stretches: knee to chest, supine HS stretch on mat  - Education re: benefits of exercise; need for cardio, strength, flexibility components ; limitations with MS (heat intolerance)    Education:    Primary learner: patient   Preferred learning style:  verbal  Barriers to learning: language   Cultural practices that influence care: none   Topic taught: Need to compensate for balance/dizzness by moving slowly holding onto something stable and asking for help when needed, try to  add cardiovascular exercise on stationary bike (alternate mod intensity endurance with intervals, try 2x prior to next visit)   Response to education: Pt stated understanding       ASSESSMENT/PATIENT RESPONSE TO THERAPY:  Pt tolerated Nu-step much better than TM. Advised her to try stationary bike in her gym at home with supervision and to get up slowly after exercise. Re: dizziness and imbalance provided extensive education re: benefits of exercise in addition to using  compensatory strategies as needed for safety with daily functional activities.     PLAN:   Continue intervention per Plan of Care.      Plan for next visit: LE strengthening, balance, try TM again    Sharol RousselBecky Kerman, PT, NCS  Physical Therapist   [facility]  Department of Rehabilitation Medicine

## 2017-08-27 MED ORDER — HYOSCYAMINE SULFATE SL 0.125 MG SL SUBL
0.1250 mg | SUBLINGUAL_TABLET | SUBLINGUAL | 6 refills | Status: DC | PRN
Start: 2017-08-27 — End: 2018-12-04

## 2017-08-29 ENCOUNTER — Encounter (HOSPITAL_BASED_OUTPATIENT_CLINIC_OR_DEPARTMENT_OTHER): Payer: Self-pay | Admitting: Rehabilitative and Restorative Service Providers"

## 2017-09-19 ENCOUNTER — Encounter (HOSPITAL_BASED_OUTPATIENT_CLINIC_OR_DEPARTMENT_OTHER): Payer: Self-pay | Admitting: Physical Medicine & Rehabilitation

## 2017-09-19 ENCOUNTER — Ambulatory Visit: Payer: Self-pay | Attending: Physical Medicine & Rehabilitation | Admitting: Physical Medicine & Rehabilitation

## 2017-09-19 VITALS — BP 113/69 | HR 72 | Temp 97.1°F | Ht 62.0 in | Wt 127.8 lb

## 2017-09-19 DIAGNOSIS — G35 Multiple sclerosis: Secondary | ICD-10-CM

## 2017-09-19 DIAGNOSIS — R42 Dizziness and giddiness: Secondary | ICD-10-CM

## 2017-09-19 DIAGNOSIS — Z7409 Other reduced mobility: Secondary | ICD-10-CM

## 2017-09-19 DIAGNOSIS — M792 Neuralgia and neuritis, unspecified: Secondary | ICD-10-CM

## 2017-09-19 DIAGNOSIS — G43709 Chronic migraine without aura, not intractable, without status migrainosus: Secondary | ICD-10-CM

## 2017-09-19 DIAGNOSIS — Z6823 Body mass index (BMI) 23.0-23.9, adult: Secondary | ICD-10-CM

## 2017-09-19 NOTE — Progress Notes (Signed)
Physical Medicine and Rehabilitation Note    Chief Complaint: dizziness/vertigo  Spanish phone interpreter used    Background neuro history (saw Dr. 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 headaches. Happened frequently before starting to Betaseron, less while on the drug.         Had weakness and inability to breath on IV steroids (?) before  Imaging:     MRI brain 01/18: As compared to 11/26/2013, multiple grossly unchanged foci of white matter signal abnormality within the brain, accounting for differences in technique. Relatively nonspecific and could be seen in setting of chronic ischemic vascular disease or demyelinating disease. No abnormal enhancement detected. No PML.     MRI brain 02/15: Stable white matter lesions periventricular and pericallosal.    MRI C spine 01/18: No cervical cord lesions. Stable focal T2 prolongation involving the thoracic spine at T2-T3 seen only on axial imaging and not well correlated on sagittal images. No new lesion or abnormal cord enhancement detected.     MRI C and T spine 04/15: No spinal cord lesions. Hyperintense lesion lower pole right kidney. C4-5 disc protrusion, effaces ventral thecal sac but does not deform the cord    Co-morbidities: positive anti-SSA    SH: Spanish speaking. Lives w daughter.      Interval history:   -last seen 06/20/17. At that time ordered gabapentin 100-300 mg at night, referred to PT for dizziness and balance, oxybutynin 5 mg daily for bladder urgency. Dr. Elenor LegatoVon Geldern also rec to minimize NSAIDs.    In interim-  -oxybutynin did not  work for her. Urology ordered solifenacin instead (but too expensive so didn't trial).  -gabapentin did help, taking 100 mg qhs. Helped a lot.   -done with PT, it did help.  Still vertigo, on almost all days, 3 times a day--brief, but makes her very afraid to fall.  In fact did 2 weeks ago fell in bathtub.  Does have a grab bar. Wants shower chair    -still doing doing HEP daily    -has charity care. Insurance too expensive.  PT recommended shower chair.    -Dr. Elon SpannerFialkow. States that bladder issues due to MS. no UTIs.     -headaches  celebrex when can't bear it.  Ibuprofen twice a week, instead using ice.  HAs still happening  HA 6-7/10, visual aura, can't walk straight, lasts for 3 days,   -when she worries that is when HAs are worse.     -takes care of granddaughter 57 years old.  Sleeps 7.5 hours, would sleep more if not for taking care of grandchild.    -dizziness can happen any time. Everything is spinning.     -Continues with eye tracking exercises      -----------------  Background rehab history:  -Mobility: L arm/leg weakness. does lose balance esp crouching (at times falls forward/backwards). Vertigo, eye movements exacerbate.  -ADLS: may feel as if will drop glasses held in L hand. ADLs/IADLs taking longer.  -Equipment: no AD.  -Speech/swallow/Respiratory:   -Therapy:   -Exercise:    -Fatigue: yes.  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    -left side decreased temperature sense.  -Pain: chronic migraine. Ice helps. Celebrex when unbearable/ibuprofen twice a week. L-sided neuropathic pain  Past: nortriptyline (made her feel bad, felt short of breath)  -Spasticity: muscle pains likely neuropathic    -Skin:    -Bowel:   -Bladder: Urinary urgency. urinary incontinence. Nocturia. Uses pads.  Saw urogyn Dr. Elon Spanner  Past: tolterodine 2 mg bid (was too expensive, didn't  take), oxybutynin (didn't work), solifenacin (too expensive)  03/14/17 voided 200 mL, PVR 0 mL.  -gyn: removal of R ovary and half of L ovary for R ovarian cyst. 1987 hysterectomy.    -Vision:      Review of Systems  12ft Walk Exam  Time (sec): 6.34  Mobility: No Mobility Aids    Symptoms  Fatigue: Quite a bit  Sleep: Not at all  Double vision: Not at all  Blurry vision: Quite a bit  Swallowing problems: Quite a bit  Dizziness / light headedness: Quite a bit  Numbness or tingling or odd sensations: Very much  Pain: Quite a bit  Weakness: Quite a bit  Falling: Not at all  Spasms or jerking: Quite a bit  Tightness or stiffness: Not at all  Bladder problems: A little bit  Bowel problems: Not at all  Sexual problems: Not at all  Depression: Not at all  Anxiety: Not at all  Problems thinking: A little bit  Heat sensitivity: Not at all  Skin problems (e.g., injection site reaction): A little bit  Heart palpitations: A little bit  Shortness of breath: Quite a bit      Current Outpatient Prescriptions:     Celecoxib (CELEBREX OR), , Disp: , Rfl:     Cholecalciferol (VITAMIN D) 2000 units Oral Cap, Take 1 capsule (2,000 Units) by mouth daily., Disp: 90 capsule, Rfl: 3    Gabapentin 100 MG Oral Cap, Take 1-3 capsules at night and 1 capsule in the morning., Disp: 360 capsule, Rfl: 3    Hyoscyamine Sulfate 0.125 MG Sublingual SL Tab, Place 1 tablet (0.125 mg) under the tongue every 4 hours as needed (incontinence)., Disp: 30 tablet, Rfl: 6    Ibuprofen (ADVIL OR), , Disp: , Rfl:     Review of patient's allergies indicates:  Allergies   Allergen Reactions    Morphine     Penicillins     Tramadol        Past Medical History:   Diagnosis Date    Headache     MS (multiple sclerosis) (HCC)        No past surgical history on file.    Social History     Social History    Marital status: Divorced     Spouse name: N/A    Number of children: N/A    Years of education: N/A     Social History Main Topics    Smoking status:  Never Smoker    Smokeless tobacco: Never Used    Alcohol use No    Drug use: No    Sexual activity: No     Other Topics Concern    Not on file     Social History Narrative    Spanish speaking. 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  Moved from [place] to [place] with her daughter.        Family History        Negative family history of: Multiple Sclerosis          Physical exam  Vitals:    09/19/17 1359   BP: 113/69   BP Cuff Size: Regular   BP Site: Right Arm   BP Position: Sitting   Pulse: 72   Temp: 97.1 F (36.2 C)   TempSrc: Temporal   Weight: 127 lb 12.8 oz (58 kg)   Height: 5\' 2"  (1.575 m)     Constit: NAD, seated in office chair.  Eyes: conjunctiva clear. Dizziness with eye movements both to left/right, up/down. Esp with looking to the left.  ENMT: speech clear  Resp: regular respirations, no acc muscle use  Mental status: alert, answers questions appropriately.  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    Vertigo, impaired mobility due to multiple sclerosis  -vertigo somewhat improved with physical therapy  -however still gets dizzy every day multiple times a day, and contributes to falls  -will trial increasing gabapentin to 100 mg bid. She does note at night she doesn't get vertigo.   -referral to neuro-ophthalmology. She gets dizzy with eye movements in all directions, especially to the left though. Wonder if there is anything that can be done for her visual problems.  -referral to social worker-to see if she could get a shower seat. Pt with limited financial resources.    Neuropathic pain, chronic migraine  -both improved  -she notes gabapentin 100 mg qhs was enough to greatly help her pain and sleep at night.  -migraine improved with cutting down NSAIDs--still with symptoms but not needing as much NSAIDs. She is using ice to help. Only using ibuprofen twice a week on  average. However, migraines still significantly bothersome.  -perhaps increased gabapentin will help with migraine prophylaxis.    Follow up April    Histories, medications and problem list have been reviewed and updated as appropriate: YES.    30 min face-to-face time spent, >50% in counseling and care coordination. Please see above assessment and plan for topics addressed. Plan above was discussed in detail with patient.

## 2017-09-19 NOTE — Patient Instructions (Addendum)
-  a veces gabapentin pueda ayudar con maureo.    Toma 100 mg por la [employer] y 100 mg por la noche  Si no es suficiente, puede tomar 100 mg tres veces por dia  Es posible que necessita pocos dias para acostumbrarse.    -Social worker-para ayuda con un silla para el bano    -[place]--sus ojos affectan su maureo mucho  [phone]

## 2017-09-23 ENCOUNTER — Telehealth (HOSPITAL_BASED_OUTPATIENT_CLINIC_OR_DEPARTMENT_OTHER): Payer: Self-pay | Admitting: Clinical

## 2017-09-23 NOTE — Telephone Encounter (Signed)
09/23/2017 [facility] Social Work Note      Current Issue:  Social Work requested by Dr Aileen Fass to assist patient with obtaining a shower chair.    Action:  This Clinical research associate called to patient with use of interpreter services, message left for patient indicating there is program for free equipment through [employer].  Alerted patient that unfortunately I do not have access to spanish language form. inquired if she has anyone at home that can assist her in completing form.  Alternatively she could schedule to meet me in clinic and I could assist her with use of interpreter.  Left direct contact number and encouraged call back.    Plan:  Social Work standing by for return contact

## 2017-11-06 NOTE — Progress Notes (Signed)
[name] did not cancel and was not present for a scheduled appointment today.  Disposition: Chart reviewed, letter sent regarding follow-up

## 2017-11-07 ENCOUNTER — Encounter (INDEPENDENT_AMBULATORY_CARE_PROVIDER_SITE_OTHER): Payer: Self-pay | Admitting: Obstetrics/Gynecology

## 2017-11-26 ENCOUNTER — Telehealth (HOSPITAL_BASED_OUTPATIENT_CLINIC_OR_DEPARTMENT_OTHER): Payer: Self-pay | Admitting: Clinical

## 2017-11-26 NOTE — Telephone Encounter (Signed)
11/26/2017 [facility] Social Work Note      Current Issue:  Social Work f/u call to patient regarding equipment needs.    Action:  No call back received from vm left 09/23/2017.  Called with interpreter services, left message with daughter encouraging patient call back to confirm whether she would be agreeable for a referral to [facility] for assistance with obtaining shower chair.    Plan:  Social Work standing by for return call

## 2017-12-12 ENCOUNTER — Ambulatory Visit (HOSPITAL_BASED_OUTPATIENT_CLINIC_OR_DEPARTMENT_OTHER): Payer: Self-pay

## 2017-12-12 ENCOUNTER — Other Ambulatory Visit: Payer: Self-pay | Admitting: Neurology

## 2017-12-12 ENCOUNTER — Ambulatory Visit: Payer: Self-pay | Attending: Neurology | Admitting: Neurology

## 2017-12-12 ENCOUNTER — Ambulatory Visit: Payer: Self-pay | Attending: Neurology

## 2017-12-12 ENCOUNTER — Encounter (HOSPITAL_BASED_OUTPATIENT_CLINIC_OR_DEPARTMENT_OTHER): Payer: Self-pay | Admitting: Neurology

## 2017-12-12 VITALS — BP 135/56 | HR 73 | Temp 97.3°F | Ht 62.0 in | Wt 125.0 lb

## 2017-12-12 DIAGNOSIS — F39 Unspecified mood [affective] disorder: Secondary | ICD-10-CM

## 2017-12-12 DIAGNOSIS — G35 Multiple sclerosis: Secondary | ICD-10-CM

## 2017-12-12 DIAGNOSIS — G43709 Chronic migraine without aura, not intractable, without status migrainosus: Secondary | ICD-10-CM

## 2017-12-12 DIAGNOSIS — M792 Neuralgia and neuritis, unspecified: Secondary | ICD-10-CM

## 2017-12-12 DIAGNOSIS — R42 Dizziness and giddiness: Secondary | ICD-10-CM

## 2017-12-12 DIAGNOSIS — Z6822 Body mass index (BMI) 22.0-22.9, adult: Secondary | ICD-10-CM

## 2017-12-12 MED ORDER — MECLIZINE HCL 12.5 MG OR TABS
12.5000 mg | ORAL_TABLET | Freq: Three times a day (TID) | ORAL | 5 refills | Status: DC | PRN
Start: 2017-12-12 — End: 2018-03-13

## 2017-12-12 MED ORDER — DULOXETINE HCL 30 MG OR CPEP
DELAYED_RELEASE_CAPSULE | ORAL | 4 refills | Status: DC
Start: 2017-12-12 — End: 2017-12-12

## 2017-12-12 MED ORDER — DULOXETINE HCL 30 MG OR CPEP
DELAYED_RELEASE_CAPSULE | ORAL | 4 refills | Status: DC
Start: 2017-12-12 — End: 2018-03-13

## 2017-12-12 MED ORDER — MECLIZINE HCL 12.5 MG OR TABS
12.5000 mg | ORAL_TABLET | Freq: Three times a day (TID) | ORAL | 5 refills | Status: DC | PRN
Start: 2017-12-12 — End: 2017-12-12

## 2017-12-12 NOTE — Progress Notes (Signed)
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 headaches. Happened frequently before starting to Betaseron, less while on the drug.         Had weakness and inability to breath on IV steroids (?) before  Imaging:     MRI brain 02/19: No significant interval change from MRI of head November 12, 2016, with an increase in the number of lesions since May 04, 2004. As previously stated, appearance is mildly atypical for demyelinating disease, but in the absence of vascular risk factors this remains the most likely diagnosis.     MRI brain 01/18: As compared to 11/26/2013, multiple grossly unchanged foci of white matter signal abnormality within the brain, accounting for differences in technique. Relatively nonspecific and could be seen in setting of chronic ischemic vascular disease or demyelinating disease. No abnormal enhancement detected. No PML.     MRI brain 02/15: Stable white matter lesions periventricular and pericallosal.       MRI C and T spine 02/19: Stable appearance of cervical and thoracic spine compared to February 15, 2014 November 12, 2016. As before, there is an equivocal small lesions seen in the right hemicord at T2-T3.    MRI C spine 01/18: No cervical cord lesions. Stable focal T2 prolongation involving the thoracic spine at T2-T3 seen only on axial imaging and not well correlated on sagittal images. No new lesion or abnormal cord enhancement detected.     MRI C and T spine 04/15: No  spinal cord lesions. Hyperintense lesion lower pole right kidney. C4-5 disc protrusion, effaces ventral thecal sac but does not deform the cord    Interval History:   - Last visit with me was 06/20/17  - Seen with phone interpreter    - Has been more tired  - Sleeps well, gets up once a night for going to the bathroom  - Headaches are more frequent  - Still gets "flashes of vertigo". Has 3-4 times a day. Can happen in any position. [facility] PT helped.   - Often followed by headache    - Has been worried more. Lives with her daughter and son-in-law and it worries her that she needs help from others  - Had MRI earlier today, which is pleasingly stable    - On Gabapentin 100mg  at night, which helps her body ache but not her headaches    ROS:   Symptoms  Fatigue: Quite a bit  Sleep: Not at all  Double vision: Not at all  Blurry vision: A little bit  Swallowing problems: Not at all  Dizziness / light headedness: A little bit  Numbness or tingling or odd sensations: Quite a bit  Pain: Very much  Weakness: Quite a bit  Tightness or stiffness: Not at all  Bladder problems: Not at all  Bowel problems: Not at all  Depression: Not at all  Anxiety: Not at all  Problems thinking: Not at all  Heat sensitivity: A little bit  Skin problems (e.g., injection site reaction): Not at all  Heart palpitations: Quite a bit  Shortness of breath: Quite a bit    Physical Exam:   BP 135/56    Pulse 73    Temp 97.3 F (36.3 C) (Temporal)    Ht 5\' 2"  (1.575 m)    Wt 125 lb (56.7 kg)    BMI 22.86 kg/m     88ft Walk Exam  Time (sec): 6.41  Mobility: No Mobility Aids    General: in no acute distress    Eyes: no scleral icterus or pallor    Respiratory: breathing comfortably on room air    Cardiovascular: no lower extremity edema      Neurologic Exam:     Mental Status: alert & oriented, during today's encounter normal attention, concentration, language, calm and appropriate in contact and mood balanced   Cranial Nerves: appropriate pupillary  responses, appropriate visual fields, EOMI, no nystagmus, no dysconjugate gaze, no INO, face symmetrical, no facial paresis, facial sensation previously decreased left face to light touch and cold (not repeated today), hearing intact to finger rub, speech fluent, no dysarthria, palate elevates symmetrically, sternocleidomastoids strong and tongue midline without fasciculations   Motor:    Tone: normal.     Atrophy/Fasiculations: not observed     Pronator Drift: none.     Strength: 5/5 in all muscle groups of the upper and lower extremities   Reflexes: deferred  Coordination: finger to nose with no dysmetria, fine finger movements and foot tapping intact   Gait and Balance: gait within exam room intact, can walk on heels and toes  Sensory: deferred    Assessment and Plan:  [name] is a 57 year old woman multiple sclerosis and chronic headaches, currently on no DMT, presenting for re-evaluation.     1. Disease modifying therapy: Reviewed today's MRI which show extensive periventricular and subcortical non-enhancing white matter lesions and non-enhancing cord lesion at T2. MRI brain is unchanged to 2017. Discussed option of starting safe DMT (glatiramer may be best option; patient also tolerated Betaseron well per her recollection, though I am somewhat concerned about increase in headaches on interferons). However, given her stable imaging off DMT (for 8 years) and lack of recent relapses (though limited historian), it also seems reasonable to hold off on DMT.   - No DMT at this time per patient preference  - Continue Vitamin D  - Not using tobacco  - Patient to contact our clinic with any new or worsening neurological problems lasting more than 24 hours    2. Symptom management:   - Vertigo: Currently her main symptom. Physical [facility] at [facility] was very helpful but this is still a problem. Patient today describes sudden brief vertigo followed by headaches, so she may (also) have vertiginous migraines.  Will therefore optimize headache treatment (see below). Start Meclizine as needed up to 3 times a day for vertigo    - Bladder problems: Urge incontinence, which may be due to prior childbirth and s/p hysterectomy. Bladder scan normal in 2018. Oxybutynine did not help. Followed by Urology.   - Fatigue: TSH normal in 07/2015. CBC within normal limits. Did not trial Amandine.    3. Migraines: Headaches worse off Nortriptyline.   - Again discussed risk of rebound/overuse headache and urged her to limit use of Celebrex or Advil etc.   - Stopped Nortriptyline due to shortness of breath  - Start Duloxetine: Take 1 cap (30mg ) in AM x 2 weeks,  then take 2 cap (60mg ) in AM for headaches and hopefully also vertigo (see above)      Return to see me in 3-6 months, sooner if needed    Histories, medications and problem list have been reviewed and updated as appropriate.   More than 50% of this encounter was spent in counseling and coordination of patient care with plan outlined above. Face-to-face time with this patient was 40 minutes.

## 2017-12-12 NOTE — Patient Instructions (Addendum)
-   Start Duloxetine: Take 1 cap (30mg ) in AM x 2 weeks, then take 2 cap (60mg ) in AM for headaches and hopefully also vertigo  - Can take Meclizine as needed up to 3 times a day for vertigo  - Try not to take Advil (Ibuprofen) or Celebrex more than 3 days a week, it may make headaches worse  - Please contact our clinic with any new or worsening neurological problems lasting more than 24 hours  - Return to see me in 3-6 months, sooner if needed

## 2018-03-13 ENCOUNTER — Ambulatory Visit (HOSPITAL_BASED_OUTPATIENT_CLINIC_OR_DEPARTMENT_OTHER): Payer: Self-pay | Admitting: Neurology

## 2018-03-13 ENCOUNTER — Ambulatory Visit: Payer: Self-pay | Attending: Physical Medicine & Rehabilitation | Admitting: Physical Medicine & Rehabilitation

## 2018-03-13 ENCOUNTER — Encounter (HOSPITAL_BASED_OUTPATIENT_CLINIC_OR_DEPARTMENT_OTHER): Payer: Self-pay | Admitting: Physical Medicine & Rehabilitation

## 2018-03-13 ENCOUNTER — Ambulatory Visit: Payer: Self-pay | Attending: Physical Medicine & Rehabilitation

## 2018-03-13 VITALS — BP 116/72 | HR 64 | Temp 97.8°F | Ht 62.0 in | Wt 122.9 lb

## 2018-03-13 DIAGNOSIS — G35 Multiple sclerosis: Secondary | ICD-10-CM

## 2018-03-13 DIAGNOSIS — M545 Low back pain, unspecified: Secondary | ICD-10-CM

## 2018-03-13 DIAGNOSIS — Z6822 Body mass index (BMI) 22.0-22.9, adult: Secondary | ICD-10-CM

## 2018-03-13 DIAGNOSIS — M1990 Unspecified osteoarthritis, unspecified site: Secondary | ICD-10-CM

## 2018-03-13 DIAGNOSIS — R42 Dizziness and giddiness: Secondary | ICD-10-CM

## 2018-03-13 DIAGNOSIS — M792 Neuralgia and neuritis, unspecified: Secondary | ICD-10-CM

## 2018-03-13 DIAGNOSIS — G8929 Other chronic pain: Secondary | ICD-10-CM

## 2018-03-13 DIAGNOSIS — G43709 Chronic migraine without aura, not intractable, without status migrainosus: Secondary | ICD-10-CM

## 2018-03-13 DIAGNOSIS — F39 Unspecified mood [affective] disorder: Secondary | ICD-10-CM

## 2018-03-13 DIAGNOSIS — R2689 Other abnormalities of gait and mobility: Secondary | ICD-10-CM

## 2018-03-13 MED ORDER — MECLIZINE HCL 12.5 MG OR TABS
12.5000 mg | ORAL_TABLET | Freq: Three times a day (TID) | ORAL | 5 refills | Status: DC | PRN
Start: 2018-03-13 — End: 2018-12-29

## 2018-03-13 MED ORDER — DULOXETINE HCL 60 MG OR CPEP
DELAYED_RELEASE_CAPSULE | ORAL | 5 refills | Status: DC
Start: 2018-03-13 — End: 2018-03-18

## 2018-03-13 MED ORDER — CELECOXIB 100 MG OR CAPS
100.0000 mg | ORAL_CAPSULE | Freq: Every day | ORAL | 5 refills | Status: DC | PRN
Start: 2018-03-13 — End: 2018-06-19

## 2018-03-13 NOTE — Progress Notes (Signed)
Physical Medicine and Rehabilitation Note    Chief Complaint: attacks when she can't function. Hard to walk, hard to move  Spanish phone interpreter used    Background neuro history (saw Dr. 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 headaches. Happened frequently before starting to Betaseron, less while on the drug.         Had weakness and inability to breath on IV steroids (?) before  Imaging:    MRI Brain/C/T spine stable 11/2017     MRI brain 01/18: As compared to 11/26/2013, multiple grossly unchanged foci of white matter signal abnormality within the brain, accounting for differences in technique. Relatively nonspecific and could be seen in setting of chronic ischemic vascular disease or demyelinating disease. No abnormal enhancement detected. No PML.     MRI brain 02/15: Stable white matter lesions periventricular and pericallosal.    MRI C spine 01/18: No cervical cord lesions. Stable focal T2 prolongation involving the thoracic spine at T2-T3 seen only on axial imaging and not well correlated on sagittal images. No new lesion or abnormal cord enhancement detected.     MRI C and T spine 04/15: No spinal cord lesions. Hyperintense lesion lower pole right kidney. C4-5 disc protrusion, effaces ventral thecal sac but does not deform the cord    Co-morbidities: positive anti-SSA    SH: Spanish speaking. Lives w daughter and son-in-law      Interval history:   -last seen 08/2017.     In interim-  -11/2017 saw Dr. Elenor Legato who started duloxetine for headaches, given meclizine prn for vertigo (not  taking). At that time MRI was stable. No DMT    -currently gabapentin 100 mg at night. Helps.  -bad headache    -duloxetine 30 mg qhs, started 3 months ago.  Did help headaches. No more nerve pains.      -dizzy all the time.  2 wks ago got dizzy and weak and couldn't walk, could see, couldn't find the words.  People couldn't understand her. Recovered slowly.   can eat, can get out of bed now.     -not feeling good. feels she gets this way "crisis" twice a month in the past.  Now crisis just once a month.  -no totally new symptoms.     -taking ibuprofen as needed. Didn't take with celebrex. Ibuprofen doesn't work now.   -not currently taking celebrex (out of). Only takes when pain very severe 2-3 times/wk.   Having a lot of pain in bones, joints (shoulders, R MCP, feet). Worst pain is in low back.     -HAs better.     -eye problem. Sees blurry. Denies double vision. Can't read. Been this way for past 2 weeks.     -no infection. No URIs/no UTIs/no burning/no diarrhea.     -bladder still leaking. Urologist said hard to treat since due to MS.    -never xray low back.     -no acid reflux/stomach pain.        -----------------------  Background rehab history:  -Mobility: L arm/leg weakness. does lose balance esp  crouching (at times falls forward/backwards). Vertigo, eye movements exacerbate.  -ADLS: may feel as if will drop glasses held in L hand. ADLs/IADLs taking longer.  -Equipment: no AD.  -Speech/swallow/Respiratory:   -Therapy:   -Exercise:    -Fatigue: yes.    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    -left side decreased temperature sense.  -Pain: chronic migraine. Ice helps. Celebrex/ibuprofen prn. L-sided neuropathic pain. Gabapentin 100 mg bid. Duloxetine 30 mg daily.  Past: nortriptyline (made her feel bad, felt short of breath)  -Spasticity: muscle pains likely neuropathic    -Skin:    -Bowel:    -Bladder: Urinary urgency. urinary incontinence. Nocturia. Uses pads.  Saw urogyn Dr. Elon Spanner  Past: tolterodine 2 mg bid (was too expensive, didn't take), oxybutynin (didn't work), solifenacin (too expensive)  03/14/17 voided 200 mL, PVR 0 mL.  -gyn: removal of R ovary and half of L ovary for R ovarian cyst. 1987 hysterectomy.    -Vision:      Review of Systems  -See above. GI, GU, Pulm, Neuro, MSK reviewed.      Current Outpatient Medications:     Celecoxib 100 MG Oral Cap, Take 1 capsule (100 mg) by mouth daily as needed for pain. Take with food., Disp: 30 capsule, Rfl: 5    Cholecalciferol (VITAMIN D) 2000 units Oral Cap, Take 1 capsule (2,000 Units) by mouth daily., Disp: 90 capsule, Rfl: 3    DULoxetine HCl 60 MG Oral CAPSULE ENTERIC COATED PARTICLES, Take 1 cap (30mg ) in AM x 2 weeks, then take 2 cap (60mg ) in AM, Disp: 30 capsule, Rfl: 5    Gabapentin 100 MG Oral Cap, Take 1-3 capsules at night and 1 capsule in the morning., Disp: 360 capsule, Rfl: 3    Hyoscyamine Sulfate 0.125 MG Sublingual SL Tab, Place 1 tablet (0.125 mg) under the tongue every 4 hours as needed (incontinence)., Disp: 30 tablet, Rfl: 6    Ibuprofen (ADVIL OR), , Disp: , Rfl:     meclizine 12.5 MG Oral Tab, Take 1 tablet (12.5 mg) by mouth 3 times a day as needed for dizziness., Disp: 60 tablet, Rfl: 5    Review of patient's allergies indicates:  Allergies   Allergen Reactions    Morphine     Penicillins     Tramadol        Past Medical History:   Diagnosis Date    Headache     MS (multiple sclerosis) (HCC)        No past surgical history on file.    Social History     Socioeconomic History    Marital status: Divorced     Spouse name: Not on file    Number of children: Not on file    Years of education: Not on file    Highest education level: Not on file   Social Needs    Financial resource strain: Not on file    Food insecurity - worry: Not on file    Food insecurity - inability: Not on file    Transportation needs -  medical: Not on file    Transportation needs - non-medical: Not on file   Occupational History    Not on file   Tobacco Use    Smoking status: Never Smoker    Smokeless tobacco: Never Used   Substance and Sexual  Activity    Alcohol use: No    Drug use: No    Sexual activity: Never   Other Topics Concern    Not on file   Social History Narrative    Spanish speaking. 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 Moved from [place] to [place] with her daughter.        Family History        Negative family history of: Multiple Sclerosis          Physical exam  Vitals:    03/13/18 1248   BP: 116/72   BP Cuff Size: Regular   BP Site: Left Arm   BP Position: Sitting   Pulse: 64   Temp: 97.8 F (36.6 C)   TempSrc: Temporal   Weight: 122 lb 14.4 oz (55.7 kg)   Height: 5\' 2"  (1.575 m)     Constit: NAD, seated in office chair.  Eyes: conjunctiva clear. Dizziness with eye movements both to left/right, up/down. Esp with looking to the left.  ENMT: speech clear  Resp: regular respirations, no acc muscle use  Mental status: alert, answers questions appropriately.  Psych: pleasant, smiling    MSK:  -right 3rd MCP thickened.    Neuro:  -gait:  Walks without assistive device but appears unsteady. She reaches for exam table for balance.  Tandem gait--very difficult with large amplitude trunk sway.  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    Multiple sclerosis  -reported to me that she was recently in "crisis." described as dizzy and weak and couldn't walk, hard to see, couldn't find the words  -reported crises happen 2x per month in the past, now just 1x/month  -This did not seem consistent with an MS relapse. I think Dr. Scot Jun Geldern's thought that these could be vertiginous  migraines make a lot of sense.  -It was difficulty to elucidate given language barrier and some difficulty with the Spanish interpreter, but overall it seems to me that patient reports she is doing better rather than worse.     Neuropathic pain  -on gabapentin 100 mg qhs and duloxetine 30 mg daily  -she reports nerve pain is gone.    Arthritis, chronic low back pain  -describes pain in back, right hand, both shoulders being bothersome. I think this sounds like she is describing joint problems rather than nerve problems.  -right 3rd MCP thickened. Positive anti-SSA. Looked in record and rheumatologist had wanted to see her again. Encouraged her to make a follow up appointment given multiple joint pains [phone]  -she had run out of celebrex. I gave her refill. She knows to take sparingly. Denies GERD, discussed to take with food.  -XR of low back due to chronicity:  Mild grade 1 anterolisthesis of L5 on S1. Otherwise, alignment is anatomic.  Probable osteopenia.  Minimal degenerative changes are present.  Mild atherosclerotic calcification of the descending aorta.  Surgical clips are present in the right upper abdomen, likely from prior   Cholecystectomy.  -->told patient minimal arthritis on X-ray. However, encouraged to speak with PCP  re: bone density and possible DEXA    Dizziness, impaired balance  -referred to physical therapy  -she had poor tolerance of eye movements especially to the left. Got very dizzy. Referred to ophthal to see if there is anything to help this.    Follow up 3 mo    Histories, medications and problem list have been reviewed and updated as appropriate: YES.    40 min face-to-face time spent, >50% in counseling and care coordination. Please see above assessment and plan for topics addressed. Plan above was discussed in detail with patient.

## 2018-03-13 NOTE — Patient Instructions (Addendum)
-   Increase Duloxetine to 60mg  a day (1 cap new strength)  - Take Meclizine as needed for dizziness  - MRI in February 2020  - Please contact our clinic with any new or worsening neurological problems lasting more than 24 hours  - Return to see me in 3-6 months

## 2018-03-13 NOTE — Patient Instructions (Addendum)
-  celebrex rellenar. toma con comida.    -Rheumatologist [facility]  [phone]. problemas con espalda y [place] y [facility]    -[facility]   [phone]    -rayos equis  (x-ray) low back at [facility]  [phone]  Pt inbox full. Sent letter saying she has mild arthritis, nothing very serious. Recommended speaking to PCP about potential DEXA scan for low bone density.    -[facility] [phone]

## 2018-03-13 NOTE — Progress Notes (Signed)
I personally saw and evaluated the patient. I discussed the patient with Dr. Si Gaul. I agree with the findings and plan as documented in the note above. I spent 40 minutes with the patient; greater than 50% of time was spent in counseling and coordination of care.    In Summary,  [name] is a 57 year old woman multiple sclerosis and chronic headaches, currently on no DMT, presenting for re-evaluation.     1. Disease modifying therapy: MRI brain in 02/19 unchanged to 2017. Clinically stable by neuro exam (though history somewhat limited due to language and cultural barriers). Briefly discussed again option of starting safe DMT (glatiramer may be best option; patient also tolerated Betaseron well per her recollection, though I am somewhat concerned about increase in headaches on interferons). However, given her stable imaging off DMT (for 8 years) and lack of recent relapses (though limited historian), mutually decided to remain off DMT.   - No DMT at this time  - Plan repeat MRI brain, C and T spine in February 2020  - Continue Vitamin D  - Not using tobacco  - Patient to contact our clinic with any new or worsening neurological problems lasting more than 24 hours    2. Symptom management:   - Vertigo: Currently her main symptom. Physical Therapy at [facility] was very helpful but this is still a problem. At least some of her vertigo may be vestibular migraines. Did not try Meclizine (rx given at last visit). Start Meclizine as needed up to 3 times a day for vertigo  - Bladder problems: Urge incontinence, which may be due to prior childbirth and s/p hysterectomy. Bladder scan normal in 2018. Oxybutynine did not help. Followed by Urology.   - Fatigue: TSH normal in 07/2015. CBC within normal limits. Did not trial Amandine.    3. Migraines: Headaches worse off Nortriptyline, somewhat better on Duloxetine. Stopped Nortriptyline due to shortness of breath  - Again discussed risk of rebound/overuse headache  -  Increase Duloxetine from 30mg  po Qday to 60mg  po Qday      Return to see me in 3-6 months, sooner if needed

## 2018-03-13 NOTE — Progress Notes (Signed)
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 headaches. Happened frequently before starting to Betaseron, less while on the drug.         Had weakness and inability to breath on IV steroids (?) before  Imaging:     MRI brain 02/19: No significant interval change from MRI of head November 12, 2016, with an increase in the number of lesions since May 04, 2004. As previously stated, appearance is mildly atypical for demyelinating disease, but in the absence of vascular risk factors this remains the most likely diagnosis.     MRI brain 01/18: As compared to 11/26/2013, multiple grossly unchanged foci of white matter signal abnormality within the brain, accounting for differences in technique. Relatively nonspecific and could be seen in setting of chronic ischemic vascular disease or demyelinating disease. No abnormal enhancement detected. No PML.     MRI brain 02/15: Stable white matter lesions periventricular and pericallosal.       MRI C and T spine 02/19: Stable appearance of cervical and thoracic spine compared to February 15, 2014 November 12, 2016. As before, there is an equivocal small lesions seen in the right hemicord at T2-T3.    MRI C spine 01/18: No cervical cord lesions. Stable focal T2 prolongation involving the thoracic spine at T2-T3 seen only on axial imaging and not well correlated on sagittal images. No new lesion or abnormal cord enhancement detected.     MRI C and T spine 04/15: No  spinal cord lesions. Hyperintense lesion lower pole right kidney. C4-5 disc protrusion, effaces ventral thecal sac but does not deform the cord    Interval History:   - Last visit with Dr. Elenor Legato   - Seen with phone interpreter.  - Start Duloxetine since last visit, currently on 30 mg at night.   - Felt that it was helpful for body pains and  "inside of body shaking".  - Continues with "crises" or attacks. Last one a couple of weeks ago - had "attack", whole body not working, not able to move or eat, blurry vision, headache. This time not as severe as before. Previously 2 attacks/mth, now only < 1 attack/month. Usually goes away in a couple of days on its own.  - Dizziness continues - has not tried meclizine, did not get from pharmacy. Did not increase duloxetine as she thought it was attributing to her dizziness.   - Continues on Gabapentin 100 mg, 1 cap at night for nerves.     ROS:   Fatigue: Quite a bit  Sleep: Not at all  Double vision: Not at all  Blurry vision: A little bit  Swallowing problems: Not at all  Dizziness / light headedness: A little bit  Numbness or tingling or odd sensations: Quite a bit  Pain: Very much  Weakness: Quite a bit  Tightness or stiffness: Not at all  Bladder problems: Not at all  Bowel problems: Not at all  Depression: Not at all  Anxiety: Not at all  Problems thinking: Not at all  Heat sensitivity: A little bit  Skin problems (e.g., injection site reaction): Not at all  Heart palpitations: Quite a bit  Shortness of breath: A little bit    Physical Exam:   BP 116/72    Pulse 64    Temp 97.8 F (36.6 C) (Temporal)    Ht 5\' 2"  (1.575 m)    Wt 122 lb (55.3 kg)    BMI 22.31 kg/m     General: in no acute distress    Eyes: no scleral icterus or pallor    Respiratory: breathing comfortably on room air    Cardiovascular: no lower extremity edema      Neurologic Exam:     Mental Status: alert & oriented, during today's encounter normal attention, concentration, language, calm and  appropriate in contact and mood balanced   Cranial Nerves:  appropriate visual fields, EOMI, no nystagmus, no dysconjugate gaze, no INO, face symmetrical, no facial paresis, facial sensation previously decreased left face to light touch, hearing intact to finger rub, speech fluent, no dysarthria, palate elevates symmetrically, sternocleidomastoids strong and tongue midline without fasciculations   Motor:    Tone: normal.     Atrophy/Fasiculations: not observed     Pronator Drift: none.     Strength: 5/5 in all muscle groups of the upper and lower extremities   Reflexes: deferred  Coordination: finger to nose with no dysmetria, fine finger movements and foot tapping intact   Gait and Balance: gait within exam room intact, can walk on heels and toes, tandem with difficulty but able to perform  Sensory: decreased to touch left hemibody including face    Assessment and Plan:  [name] is a 57 year old woman multiple sclerosis and chronic headaches, currently on no DMT, presenting for re-evaluation.     1. Disease modifying therapy:  MRI in 11/2017 with extensive periventricular and subcortical non-enhancing white matter lesions and non-enhancing cord lesion at T2. MRI brain is unchanged to 2017. Previously discussed option of starting safe DMT (glatiramer may be best option; patient also tolerated Betaseron well per her recollection, though I am somewhat concerned about increase in headaches on interferons). However, given her stable imaging off DMT (for 8 years) and lack of recent relapses (though limited historian), it also seems reasonable to hold off on DMT.   - Continues off DMT at this time per patient preference  - Continue Vitamin D  - Not using tobacco  - Patient to contact our clinic with any new or worsening neurological problems lasting more than 24 hours    2. Symptom management:   - Vertigo: Continues with constant vertigo. Physical Therapy at [facility] was very helpful but this is still a  problem. Describedsudden brief vertigo followed by headaches, so she may (also) have vertiginous migraines. Will therefore optimize headache treatment (see below). Did not trial meclizine yet.   - Meclizine sent again to pharmacy to take as needed up to 3 times a day for vertigo    - Bladder problems: Urge incontinence, which may be due to prior childbirth and s/p hysterectomy. Bladder scan normal in 2018. Oxybutynine did not help. Followed by Urology.   - Fatigue: TSH normal in 07/2015. CBC within normal limits. Did not trial Amandine.    3. Migraines: Headaches worse off Nortriptyline. Previously discussed risk of rebound/overuse headache and urged her to limit use of Celebrex or Advil etc. Stopped Nortriptyline due to shortness of  breath. Duloxetine has helped with her "crises" that likely related to her migraines.   - Increase Duloxetine 60 mg daily for headaches    Return to see Dr. Elenor Legato in 3-6 months, sooner if needed    Histories, medications and problem list have been reviewed and updated as appropriate.

## 2018-03-18 ENCOUNTER — Other Ambulatory Visit (HOSPITAL_BASED_OUTPATIENT_CLINIC_OR_DEPARTMENT_OTHER): Payer: Self-pay | Admitting: Pharmacist

## 2018-03-18 DIAGNOSIS — G35 Multiple sclerosis: Secondary | ICD-10-CM

## 2018-03-18 DIAGNOSIS — F39 Unspecified mood [affective] disorder: Secondary | ICD-10-CM

## 2018-03-18 MED ORDER — DULOXETINE HCL 30 MG OR CPEP
30.0000 mg | DELAYED_RELEASE_CAPSULE | Freq: Every day | ORAL | 5 refills | Status: DC
Start: 2018-03-18 — End: 2018-03-27

## 2018-03-18 NOTE — Telephone Encounter (Signed)
Clarification of duloxetine prescription written 03/13/18.

## 2018-03-26 ENCOUNTER — Telehealth (HOSPITAL_BASED_OUTPATIENT_CLINIC_OR_DEPARTMENT_OTHER): Payer: Self-pay

## 2018-03-26 DIAGNOSIS — G35 Multiple sclerosis: Secondary | ICD-10-CM

## 2018-03-26 DIAGNOSIS — F39 Unspecified mood [affective] disorder: Secondary | ICD-10-CM

## 2018-03-26 NOTE — Telephone Encounter (Signed)
Received a call from the pt with a Spanish interpreter. Pt states that her duloxetine Rx was increased to 60mg  daily but the Rx has been sent incorrectly twice. Most recent was sent for the 30mg  caps.     Pt would like the corrected Rx of one 60mg  cap QD for cost reasons. Please send new Rx.

## 2018-03-27 MED ORDER — DULOXETINE HCL 60 MG OR CPEP
60.0000 mg | DELAYED_RELEASE_CAPSULE | Freq: Every day | ORAL | 5 refills | Status: DC
Start: 2018-03-27 — End: 2018-12-04

## 2018-04-07 ENCOUNTER — Ambulatory Visit (HOSPITAL_BASED_OUTPATIENT_CLINIC_OR_DEPARTMENT_OTHER): Payer: Self-pay | Attending: Ophthalmology | Admitting: Ophthalmology

## 2018-04-07 DIAGNOSIS — H11003 Unspecified pterygium of eye, bilateral: Secondary | ICD-10-CM

## 2018-04-07 DIAGNOSIS — G35 Multiple sclerosis: Secondary | ICD-10-CM

## 2018-04-07 MED ORDER — HYPROMELLOSE 0.4 % OP SOLN
1.0000 [drp] | Freq: Four times a day (QID) | OPHTHALMIC | 11 refills | Status: DC | PRN
Start: 2018-04-07 — End: 2018-12-29

## 2018-04-07 NOTE — Progress Notes (Addendum)
Chief complaint: h/o multiple sclerosis    HPI:   57yo F with multiple sclerosis presents for multiple sclerosis evaluation and decreased vision LE.    Patient was diagnosed with RRMS in 2004 based on -- headaches, balance problems, double vision, MRI brain; no spinal cord lesions; LP neg.  Patient is followed by Neurology (Dr. Si Gaul and Dr. Elenor Legato), last seen by them 03/13/18.  She had repeat imaging 11/2017, overall imaging has been stable off DMT for about 8 years.  She has had subjective lack of relapses, though is a poor historian. Per neurology, she will continue off DMT at this time given patient preference and stable imaging.      Her symptoms include vertigo (on meclizine), urge incontinence (previous oxybutyinine did not help) and migraines (on duloxetine). In terms of her vision, she describes her vision does not feel clear in the LE. She has glasses but says they do not help -- however, these are 58 years old.  She describes a foreign body sensation and dryness from LE, no pain with EOMs. Dr. Elenor Legato recommended an eye evaluation.    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 headaches. Happened frequently before starting to Betaseron, less while on the drug.    Workup:  MRI brain w/wo contrast 11/2017  1. No significant interval change from MRI of head November 12, 2016, with an   increase in the number of lesions since May 04, 2004. As previously stated,   appearance is mildly atypical for demyelinating disease, but in the absence of  vascular risk factors this remains the most likely  diagnosis.    MRI Tspine w contrast 11/2017  1. Stable appearance of cervical and thoracic spine compared to February 15, 2014  November 12, 2016. As before, there is an equivocal small lesions seen in the   right hemicord at T2-T3.    MRI Cspine w contrast 11/2017  1. Stable appearance of cervical and thoracic spine compared to February 15, 2014  November 12, 2016. As before, there is an equivocal small lesions seen in the   right hemicord at T2-T3.      POcHx:   Glasses    Oc Meds:  None    PMHx:   Active Ambulatory Problems     Diagnosis Date Noted    Multiple sclerosis (HCC) 09/18/2016    Chronic migraine without aura without status migrainosus, not intractable 09/18/2016    Vertigo 09/18/2016    Impaired functional mobility, balance, and endurance 10/03/2016    Chronic fatigue 03/14/2017    Urgency incontinence 03/14/2017     Resolved Ambulatory Problems     Diagnosis Date Noted    No Resolved Ambulatory Problems     Past Medical History:   Diagnosis Date    Headache     MS (multiple sclerosis) (HCC)        PSurgHx:   No past surgical history on file.    Meds:     Current Outpatient Medications:     Celecoxib 100 MG Oral Cap, Take 1 capsule (100 mg) by mouth daily  as needed for pain. Take with food., Disp: 30 capsule, Rfl: 5    Cholecalciferol (VITAMIN D) 2000 units Oral Cap, Take 1 capsule (2,000 Units) by mouth daily., Disp: 90 capsule, Rfl: 3    DULoxetine HCl 60 MG Oral CAPSULE ENTERIC COATED PARTICLES, Take 1 capsule (60 mg) by mouth daily., Disp: 30 capsule, Rfl: 5    Gabapentin 100 MG Oral Cap, Take 1-3 capsules at night and 1 capsule in the morning., Disp: 360 capsule, Rfl: 3    Hyoscyamine Sulfate 0.125 MG Sublingual SL Tab, Place 1 tablet (0.125 mg) under the tongue every 4 hours as needed (incontinence)., Disp: 30 tablet, Rfl: 6    Ibuprofen (ADVIL OR), , Disp: , Rfl:     meclizine 12.5 MG Oral Tab, Take 1 tablet (12.5 mg) by mouth 3 times a day as needed for dizziness., Disp: 60 tablet, Rfl:  5    Allergies:  Morphine, tramadol, PCN    Soc Hx:   Denies smoking, alcohol, or illicit drug use.     Family Hx:  No family history of eye disease, denies history of glaucoma, macular degeneration or blindness    ROS: Comprehensive review of systems performed and All negative except for HPI    Physical Exam:  General: NAD  Eye: see eye exam  Head/ENT: NCAT, ears and nose have normal external appearance  Skin: no facial rashes  Neck: No JVD  Pulm: no acute respiratory distress  Psych: affect and behavior normal  Neuro: CN and motor function are grossly intact     See Ophth Exam section    OCT nerve 04/07/18  G103/101, full both eyes    Assessment/Plan:  1. h/o multiple sclerosis  -followed by Dr. Elenor Legato (neurology)  -MRI stable from 2017 off DMT  -no prior history of optic neuritis per patient and records  -no evidence of optic nerve edema or pallor, OCT full   -baseline HVF next visit 72mo    2. Pterygium LE>RE  -LE pterygium approaching visual axis causing astigmatism (3D)  -BCVA 20/20  -MRx dispensed   -monitor for now    3. Dry Eye LE  -pterygium-surface related  -start ATs QID      RTC 72mo with baseline HVF for MS, yearly after    [employer]. Johny Drilling, MD  [facility], R[facility]

## 2018-04-07 NOTE — Progress Notes (Signed)
SD-OCT performed in clinic, BE optic nerves, RNFL & posterior pole w/good cooperation.  Images/reports stored on [facility] PACS.

## 2018-04-07 NOTE — Addendum Note (Signed)
Addended by: Rogelia Boga on: 04/07/2018 11:35 AM     Modules accepted: Orders

## 2018-04-18 NOTE — Progress Notes (Signed)
I did not see the patient, but have reviewed the findings and agree with the resident's assessment.  Charleton Deyoung, MD

## 2018-06-19 ENCOUNTER — Encounter (HOSPITAL_BASED_OUTPATIENT_CLINIC_OR_DEPARTMENT_OTHER): Payer: Self-pay | Admitting: Physical Medicine & Rehabilitation

## 2018-06-19 ENCOUNTER — Ambulatory Visit: Payer: Self-pay | Attending: Physical Medicine & Rehabilitation | Admitting: Physical Medicine & Rehabilitation

## 2018-06-19 VITALS — BP 122/62 | HR 72 | Temp 97.5°F | Ht 62.0 in | Wt 122.0 lb

## 2018-06-19 DIAGNOSIS — R42 Dizziness and giddiness: Secondary | ICD-10-CM

## 2018-06-19 DIAGNOSIS — M1990 Unspecified osteoarthritis, unspecified site: Secondary | ICD-10-CM

## 2018-06-19 DIAGNOSIS — R202 Paresthesia of skin: Secondary | ICD-10-CM

## 2018-06-19 DIAGNOSIS — Z6822 Body mass index (BMI) 22.0-22.9, adult: Secondary | ICD-10-CM

## 2018-06-19 DIAGNOSIS — M8580 Other specified disorders of bone density and structure, unspecified site: Secondary | ICD-10-CM

## 2018-06-19 DIAGNOSIS — G35 Multiple sclerosis: Secondary | ICD-10-CM

## 2018-06-19 DIAGNOSIS — R200 Anesthesia of skin: Secondary | ICD-10-CM

## 2018-06-19 MED ORDER — CELECOXIB 200 MG OR CAPS
200.0000 mg | ORAL_CAPSULE | Freq: Every day | ORAL | 3 refills | Status: DC | PRN
Start: 2018-06-19 — End: 2018-12-04

## 2018-06-19 NOTE — Progress Notes (Signed)
Physical Medicine and Rehabilitation Note    Chief Complaint: rehab needs in multiple sclerosis  Phone spanish interpreter    Background neuro history (saw Dr. 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 headaches. Happened frequently before starting to Betaseron, less while on the drug.         Had weakness and inability to breath on IV steroids (?) before  Imaging:    MRI Brain/C/T spine stable 11/2017     MRI brain 01/18: As compared to 11/26/2013, multiple grossly unchanged foci of white matter signal abnormality within the brain, accounting for differences in technique. Relatively nonspecific and could be seen in setting of chronic ischemic vascular disease or demyelinating disease. No abnormal enhancement detected. No PML.     MRI brain 02/15: Stable white matter lesions periventricular and pericallosal.    MRI C spine 01/18: No cervical cord lesions. Stable focal T2 prolongation involving the thoracic spine at T2-T3 seen only on axial imaging and not well correlated on sagittal images. No new lesion or abnormal cord enhancement detected.     MRI C and T spine 04/15: No spinal cord lesions. Hyperintense lesion lower pole right kidney. C4-5 disc protrusion, effaces ventral thecal sac but does not deform the cord    Co-morbidities: positive anti-SSA    SH: Spanish speaking. Lives w daughter and son-in-law    Results:  -02/2018 XR low back-mild grade 1 anterolisthesis of L5 on S1 and probable osteopenia      Interval history:   -last seen 02/2018. At that time encouraged her to see rheum again (didn't  see), refilled celebrex, XR of low back showed mild grade 1 anterolisthesis of L5 on S1 and probable osteopenia, discussed getting DEXA scan, referred to PT, referred to ophthal for dizziness looking to the left. At that time duloxetine raised to 60 mg daily by neuro, who also ordered meclizine prn dizziness.     In interim-  -03/2018 saw Dr. Rogelia Boga (resident, ophthal)-full OCTs, Left pterygium causing astigmatism, 20/20, dry eyes. Follow up 6 months.    -only a little dizzy. Very little pain. No more tingling.   -feels happy  -on duloxetine 60 mg daily.    -unable to buy glasses, too expensive. Dry eye drops helped.    -joints okay today x 1 wk.    -meclizine. A few times a week. Feels it is too strong. Takes at night.     -didn't end up doing PT, reports nobody called her.    -taking celebrex 1 time/week. Since feeling okay. No stomach pain.  Requests higher dose. In past took 200 mg instead of 100 mg, needs to take 2 to work.     -not falling.   Taking caution in bathroom. Sits when showering.     -left hand weak.  May drop things especially L hand.  Altered sensation 3rd-5th fingers.      ----------------  Background rehab history:  -Mobility: L arm/leg weakness. does lose balance esp crouching (at times falls forward/backwards). Vertigo, eye movements exacerbate.  -ADLS: may feel as if will drop glasses held  in L hand. ADLs/IADLs taking longer.  -Equipment: no AD.  -Speech/swallow/Respiratory:   -Therapy:   -Exercise:    -Fatigue: yes.    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    -left side decreased temperature sense.  -Pain: chronic migraine. Ice helps. Celebrex/ibuprofen prn. L-sided neuropathic pain. Joint pains. Gabapentin 100 mg qhs. Duloxetine 60 mg daily.  Past: nortriptyline (made her feel bad, felt short of breath)  -Spasticity: muscle pains likely neuropathic    -Skin:    -Bowel:    -Bladder: Urinary urgency. urinary incontinence. Nocturia. Uses pads.  Saw urogyn Dr. Elon Spanner  Past: tolterodine 2 mg bid (was too expensive, didn't take), oxybutynin (didn't work), solifenacin (too expensive)  03/14/17 voided 200 mL, PVR 0 mL.  -gyn: removal of R ovary and half of L ovary for R ovarian cyst. 1987 hysterectomy.    -Vision: 03/2018 saw Dr. Rogelia Boga (resident, ophthal)-full OCTs, Left pterygium causing astigmatism, 20/20, dry eyes. Follow up 6 months.      Review of Systems  68ft Walk Exam  Time (sec): 6.43  Mobility: No Mobility Aids    Symptoms  Fatigue: A little bit  Sleep: Not at all  Blurry vision: A little bit  Swallowing problems: A little bit  Dizziness / light headedness: Quite a bit  Numbness or tingling or odd sensations: Quite a bit  Pain: Quite a bit  Weakness: Quite a bit  Bladder problems: A little bit  Bowel problems: Not at all  Depression: Not at all  Anxiety: Not at all  Problems thinking: Not at all  Skin problems (e.g., injection site reaction): A little bit  Heart palpitations: Quite a bit  Shortness of breath: Quite a bit        Current Outpatient Medications:     amantadine 100 MG Oral Capsule, Take 100 mg by mouth., Disp: , Rfl:     BIOTIN OR, Take by mouth., Disp: , Rfl:     celecoxib 200 MG Oral Capsule, Take 1 capsule (200 mg) by mouth daily as needed for pain. Take with food., Disp: 90 capsule, Rfl: 3    Cholecalciferol (VITAMIN D) 2000 units Oral Cap, Take 1 capsule (2,000 Units) by mouth daily., Disp: 90 capsule, Rfl: 3    DULoxetine HCl 60 MG Oral CAPSULE ENTERIC COATED PARTICLES, Take 1 capsule (60 mg) by mouth daily., Disp: 30 capsule, Rfl: 5    Ergocalciferol (VITAMIN D OR), Take by mouth., Disp: , Rfl:     Gabapentin 100 MG Oral Cap, Take 1-3 capsules at night and 1 capsule in the morning., Disp: 360 capsule, Rfl: 3    Hyoscyamine Sulfate 0.125 MG Sublingual SL Tab, Place 1 tablet (0.125 mg) under the tongue every 4 hours as needed (incontinence).,  Disp: 30 tablet, Rfl: 6    Hypromellose (ARTIFICIAL TEARS) 0.4 % Ophthalmic Solution, Place 1 drop in each EYE 4 times a day as needed for dry eyes., Disp: 5 mL, Rfl: 11    Ibuprofen (ADVIL OR), , Disp: , Rfl:     MAGNESIUM OR, Take by mouth., Disp: , Rfl:     meclizine 12.5 MG Oral Tab, Take 1 tablet (12.5 mg) by mouth 3 times a day as needed for dizziness., Disp: 60 tablet, Rfl: 5    Review of patient's allergies indicates:  Allergies   Allergen Reactions  Morphine     Penicillins     Tramadol        Past Medical History:   Diagnosis Date    Headache     MS (multiple sclerosis) (HCC)        No past surgical history on file.    Social History     Socioeconomic History    Marital status: Divorced     Spouse name: Not on file    Number of children: Not on file    Years of education: Not on file    Highest education level: Not on file   Occupational History    Not on file   Social Needs    Financial resource strain: Not on file    Food insecurity:     Worry: Not on file     Inability: Not on file    Transportation needs:     Medical: Not on file     Non-medical: Not on file   Tobacco Use    Smoking status: Never Smoker    Smokeless tobacco: Never Used   Substance and Sexual Activity    Alcohol use: No    Drug use: No    Sexual activity: Never   Lifestyle    Physical activity:     Days per week: Not on file     Minutes per session: Not on file    Stress: Not on file   Relationships    Social connections:     Talks on phone: Not on file     Gets together: Not on file     Attends religious service: Not on file     Active member of club or organization: Not on file     Attends meetings of clubs or organizations: Not on file     Relationship status: Not on file    Intimate partner violence:     Fear of current or ex partner: Not on file     Emotionally abused: Not on file     Physically abused: Not on file     Forced sexual activity: Not on file   Other Topics Concern    Not on file   Social  History Narrative    Spanish speaking. 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 Moved from [place] to [place] with her daughter.        Family History        Negative family history of: Multiple Sclerosis          Physical exam  Vitals:    06/19/18 0804   BP: 122/62   BP Cuff Size: Regular   BP Site: Left Arm   BP Position: Sitting   Pulse: 72   Temp: 97.5 F (36.4 C)   TempSrc: Temporal   Weight: 122 lb (55.3 kg)   Height: 5\' 2"  (1.575 m)     Constit: NAD, seated in office chair.  Eyes: conjunctiva clear.   ENMT: speech clear  Resp: regular respirations, no acc muscle use  Mental status: alert, answers questions appropriately.  Psych: pleasant, smiling    Neuro:  -phalens: reports increased L 3rd-5th digit numbness  -pinprick: decreased L 3rd-5th digits.  -gait:  Walks without assistive device, balance improved  Tandem gait--significant trunk sway but able to do.  -motor:  Right  SAB5  EF5  EE5  Grip5    Left  SAB5  EF5  EE5  Grip5-  Right  HF4+  HAbd4+  KE5  DF5  PF5    Left  HF4  HAbd4+  KE5  DF5  PF5      Assessment/Plan:  57 yo F with relapsing remitting multiple sclerosis affecting brain and spine, not on DMT.    Multiple sclerosis  -doing well, balance improved, pain improved.  -only concern is left hand seems weaker.    Vertigo  -I think Dr. Scot Jun Geldern's thought that vertigo was due to vertiginous migraines was correct.  -on increased duloxetine 60 mg daily, pt is doing MUCH better, balance and dizziness both better.    Arthritis, chronic low back pain  -reports pain much better.  -on celebrex, takes 2 pills at once, uses rarely. Denies stomach issues. Knows to take with food. Requests 200 mg instead of 100 mg pill  -Rx celebrex 200 mg prn given.    Left hand numbness  -dicussed differential MS vs. Cubital tunnel syndrome  -referred for nerve conduction study [phone]    Osteopenia  -decreased bone density seen on XR.   -discussed need to get DEXA scan, will  refer to PCP to treat vs. Bone health specialist if osteoporosis seen.  [phone]  [facility])    Follow up 3 mo    Histories, medications and problem list have been personally reviewed and updated as appropriate: YES.    40 min face-to-face time spent, >50% in counseling and care coordination. Please see above assessment and plan for topics addressed. Plan above was discussed in detail with patient.

## 2018-06-19 NOTE — Patient Instructions (Addendum)
prueba densidad osea: [phone]  [facility])    celebrex aumento a 200 mg por dia si necesite    estudio de la conduccion nervios [phone] [place] [facility])

## 2018-09-11 ENCOUNTER — Encounter (HOSPITAL_BASED_OUTPATIENT_CLINIC_OR_DEPARTMENT_OTHER): Payer: Self-pay | Admitting: Physical Medicine & Rehabilitation

## 2018-09-14 ENCOUNTER — Encounter (HOSPITAL_BASED_OUTPATIENT_CLINIC_OR_DEPARTMENT_OTHER): Payer: Self-pay | Admitting: Neurology

## 2018-09-25 ENCOUNTER — Encounter (HOSPITAL_BASED_OUTPATIENT_CLINIC_OR_DEPARTMENT_OTHER): Payer: Self-pay

## 2018-10-27 ENCOUNTER — Telehealth (HOSPITAL_BASED_OUTPATIENT_CLINIC_OR_DEPARTMENT_OTHER): Payer: Self-pay | Admitting: Physical Medicine & Rehabilitation

## 2018-10-27 NOTE — Telephone Encounter (Signed)
Left voice message for patient with interpreter to reschedule appt. With Dr. Aileen Fass on 11/05/2018 provider out in AM. gave [phone] x2.

## 2018-10-28 ENCOUNTER — Emergency Department (HOSPITAL_BASED_OUTPATIENT_CLINIC_OR_DEPARTMENT_OTHER)
Admission: EM | Admit: 2018-10-28 | Discharge: 2018-10-28 | Disposition: A | Discharge status: Home / Self Care | Payer: Self-pay | Attending: Emergency Medicine | Admitting: Emergency Medicine

## 2018-10-28 ENCOUNTER — Other Ambulatory Visit: Payer: Self-pay | Admitting: Emergency Medicine

## 2018-10-28 DIAGNOSIS — R05 Cough: Secondary | ICD-10-CM | POA: Insufficient documentation

## 2018-10-28 DIAGNOSIS — R079 Chest pain, unspecified: Secondary | ICD-10-CM

## 2018-10-28 LAB — CBC (HEMOGRAM)
Hematocrit: 35 % — ABNORMAL LOW (ref 36–45)
Hemoglobin: 11.6 g/dL (ref 11.5–15.5)
MCH: 29.6 pg (ref 27.3–33.6)
MCHC: 33.3 g/dL (ref 32.2–36.5)
MCV: 89 fL (ref 81–98)
Platelet Count: 299 10*3/uL (ref 150–400)
RBC: 3.92 10*6/uL (ref 3.80–5.00)
RDW-CV: 12.2 % (ref 11.6–14.4)
WBC: 7.48 10*3/uL (ref 4.30–10.00)

## 2018-10-28 LAB — BASIC METABOLIC PANEL
Anion Gap: 7 (ref 4–12)
Calcium: 8.9 mg/dL (ref 8.9–10.2)
Carbon Dioxide, Total: 26 meq/L (ref 22–32)
Chloride: 106 meq/L (ref 98–108)
Creatinine: 0.53 mg/dL (ref 0.38–1.02)
GFR, Calc, African American: >60 mL/min/{1.73_m2} (ref >59)
GFR, Calc, European American: >60 mL/min/{1.73_m2} (ref >59)
Glucose: 97 mg/dL (ref 62–125)
Potassium: 4 meq/L (ref 3.6–5.2)
Sodium: 139 meq/L (ref 135–145)
Urea Nitrogen: 14 mg/dL (ref 8–21)

## 2018-10-28 LAB — TROPONIN_I
Troponin_I Interpretation: NORMAL
Troponin_I: <0.03 ng/mL (ref <0.04)

## 2018-10-28 LAB — INFLUENZA A, B AND RSV, RAPID PCR
Rapid Influenza A PCR Result: NEGATIVE
Rapid Influenza B PCR Result: NEGATIVE
Rapid RSV PCR Result: NEGATIVE

## 2018-10-30 ENCOUNTER — Encounter (HOSPITAL_BASED_OUTPATIENT_CLINIC_OR_DEPARTMENT_OTHER): Payer: Self-pay | Admitting: Physical Medicine & Rehabilitation

## 2018-10-30 ENCOUNTER — Ambulatory Visit (HOSPITAL_BASED_OUTPATIENT_CLINIC_OR_DEPARTMENT_OTHER): Payer: Self-pay | Attending: Physical Medicine & Rehabilitation

## 2018-10-30 ENCOUNTER — Encounter (HOSPITAL_BASED_OUTPATIENT_CLINIC_OR_DEPARTMENT_OTHER): Payer: Self-pay

## 2018-10-30 DIAGNOSIS — G35 Multiple sclerosis: Secondary | ICD-10-CM

## 2018-10-30 DIAGNOSIS — M8580 Other specified disorders of bone density and structure, unspecified site: Secondary | ICD-10-CM

## 2018-10-30 DIAGNOSIS — M818 Other osteoporosis without current pathological fracture: Secondary | ICD-10-CM | POA: Insufficient documentation

## 2018-10-30 DIAGNOSIS — M810 Age-related osteoporosis without current pathological fracture: Secondary | ICD-10-CM

## 2018-10-30 DIAGNOSIS — M816 Localized osteoporosis [Lequesne]: Secondary | ICD-10-CM

## 2018-10-30 NOTE — Result Encounter Note (Signed)
Will recommend referral to bone health.

## 2018-10-30 NOTE — Progress Notes (Signed)
Referring to bone health for osteoporosis seen on DEXA 10/2018

## 2018-11-05 ENCOUNTER — Ambulatory Visit: Payer: Self-pay | Attending: Neurology | Admitting: Neurology

## 2018-11-05 ENCOUNTER — Encounter (HOSPITAL_BASED_OUTPATIENT_CLINIC_OR_DEPARTMENT_OTHER): Payer: Self-pay | Admitting: Specialist

## 2018-11-05 ENCOUNTER — Encounter (HOSPITAL_BASED_OUTPATIENT_CLINIC_OR_DEPARTMENT_OTHER): Payer: Self-pay | Admitting: Physical Medicine & Rehabilitation

## 2018-11-05 DIAGNOSIS — R200 Anesthesia of skin: Secondary | ICD-10-CM

## 2018-11-05 DIAGNOSIS — R29898 Other symptoms and signs involving the musculoskeletal system: Secondary | ICD-10-CM

## 2018-11-09 ENCOUNTER — Encounter (HOSPITAL_BASED_OUTPATIENT_CLINIC_OR_DEPARTMENT_OTHER): Payer: Self-pay | Admitting: Physical Medicine & Rehabilitation

## 2018-11-09 NOTE — Progress Notes (Signed)
Preliminary EMG report pasted here for reference, done at [facility] by Dr. Joice Loftsistad.        Patient: Debra Torres, Cornell BarmanRosita Patient ID: [id number] Test Date: 11/05/2018   Ref Physician: Dessa PhiGloria Equilla Que   Pt Age: [age] year   Physician: Kandis BanB. Jane Distad, MD Height:  cm Gender: Female     Patient Complaints:  Evaluation of 2 years arm and leg sensory changes and weakness for left ulnar neuropathy. She has weaker grip more recently.    Procedure:  Nerve conduction studies (NCS) were performed with surface electrodes. Electromyography (EMG) studies were performed with concentric needle electrodes.    Description: The results of the nerve conduction studies are normal.            The results of the needle EMG examination are normal.    Impression: There is no electrodiagnostic evidence of left median or ulnar neuropathy or left C8 radiculopathy.

## 2018-11-10 ENCOUNTER — Encounter (HOSPITAL_BASED_OUTPATIENT_CLINIC_OR_DEPARTMENT_OTHER): Payer: Self-pay | Admitting: Neurology

## 2018-11-27 ENCOUNTER — Encounter (HOSPITAL_BASED_OUTPATIENT_CLINIC_OR_DEPARTMENT_OTHER): Payer: Self-pay | Admitting: Physical Medicine & Rehabilitation

## 2018-11-27 NOTE — Progress Notes (Deleted)
Physical Medicine and Rehabilitation Note    Chief Complaint: ***  Phone spanish interpreter    Background neuro history (saw Dr. 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 headaches. Happened frequently before starting to Betaseron, less while on the drug.         Had weakness and inability to breath on IV steroids (?) before  Imaging:    MRI Brain/C/T spine stable 11/2017     MRI brain 01/18: As compared to 11/26/2013, multiple grossly unchanged foci of white matter signal abnormality within the brain, accounting for differences in technique. Relatively nonspecific and could be seen in setting of chronic ischemic vascular disease or demyelinating disease. No abnormal enhancement detected. No PML.     MRI brain 02/15: Stable white matter lesions periventricular and pericallosal.    MRI C spine 01/18: No cervical cord lesions. Stable focal T2 prolongation involving the thoracic spine at T2-T3 seen only on axial imaging and not well correlated on sagittal images. No new lesion or abnormal cord enhancement detected.     MRI C and T spine 04/15: No spinal cord lesions. Hyperintense lesion lower pole right kidney. C4-5 disc protrusion, effaces ventral thecal sac but does not deform the cord    Co-morbidities: positive anti-SSA    SH: Spanish speaking. Lives w daughter and son-in-law    Results:  -02/2018 XR low back-mild grade 1 anterolisthesis of L5 on S1 and probable osteopenia      Interval history:   -last seen 05/2018. At that time ordered DEXA, ewdweews doe WMF/NXC due to L hand numbness.    In  interim-      -------------  -last seen 02/2018. At that time encouraged her to see rheum again (didn't see), refilled celebrex, XR of low back showed mild grade 1 anterolisthesis of L5 on S1 and probable osteopenia, discussed getting DEXA scan, referred to PT, referred to ophthal for dizziness looking to the left. At that time duloxetine raised to 60 mg daily by neuro, who also ordered meclizine prn dizziness.     In interim-  -03/2018 saw Dr. Rogelia BogaNicholas Chan (resident, ophthal)-full OCTs, Left pterygium causing astigmatism, 20/20, dry eyes. Follow up 6 months.    -only a little dizzy. Very little pain. No more tingling.   -feels happy  -on duloxetine 60 mg daily.    -unable to buy glasses, too expensive. Dry eye drops helped.    -joints okay today x 1 wk.    -meclizine. A few times a week. Feels it is too strong. Takes at night.     -didn't end up doing PT, reports nobody called her.    -taking celebrex 1 time/week. Since feeling okay. No stomach pain.  Requests higher dose. In past took 200 mg instead of 100 mg, needs to take 2 to work.     -not falling.   Taking caution in bathroom. Sits when showering.     -left hand weak.  May drop things especially L hand.  Altered sensation 3rd-5th fingers.      ----------------  Background rehab history:  -Mobility: L arm/leg  weakness. does lose balance esp crouching (at times falls forward/backwards). Vertigo, eye movements exacerbate.  -ADLS: may feel as if will drop glasses held in L hand. ADLs/IADLs taking longer.  -Equipment: no AD.  -Speech/swallow/Respiratory:   -Therapy:   -Exercise:    -Fatigue: yes.    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    -left side decreased temperature sense.  -Pain: chronic migraine. Ice helps. Celebrex/ibuprofen prn. L-sided neuropathic pain. Joint pains. Gabapentin 100 mg qhs. Duloxetine 60 mg daily.  Past: nortriptyline (made  her feel bad, felt short of breath)  -Spasticity: muscle pains likely neuropathic    -Skin:    -Bowel:   -Bladder: Urinary urgency. urinary incontinence. Nocturia. Uses pads.  Saw urogyn Dr. Elon Spanner  Past: tolterodine 2 mg bid (was too expensive, didn't take), oxybutynin (didn't work), solifenacin (too expensive)  03/14/17 voided 200 mL, PVR 0 mL.  -gyn: removal of R ovary and half of L ovary for R ovarian cyst. 1987 hysterectomy.    -Vision: 03/2018 saw Dr. Rogelia Boga (resident, ophthal)-full OCTs, Left pterygium causing astigmatism, 20/20, dry eyes. Follow up 6 months.      Review of Systems  ***      Current Outpatient Medications:   .  amantadine 100 MG Oral Capsule, Take 100 mg by mouth., Disp: , Rfl:   .  BIOTIN OR, Take by mouth., Disp: , Rfl:   .  celecoxib 200 MG Oral Capsule, Take 1 capsule (200 mg) by mouth daily as needed for pain. Take with food., Disp: 90 capsule, Rfl: 3  .  Cholecalciferol (VITAMIN D) 2000 units Oral Cap, Take 1 capsule (2,000 Units) by mouth daily., Disp: 90 capsule, Rfl: 3  .  DULoxetine HCl 60 MG Oral CAPSULE ENTERIC COATED PARTICLES, Take 1 capsule (60 mg) by mouth daily., Disp: 30 capsule, Rfl: 5  .  Ergocalciferol (VITAMIN D OR), Take by mouth., Disp: , Rfl:   .  Gabapentin 100 MG Oral Cap, Take 1-3 capsules at night and 1 capsule in the morning., Disp: 360 capsule, Rfl: 3  .  Hyoscyamine Sulfate 0.125 MG Sublingual SL Tab, Place 1 tablet (0.125 mg) under the tongue every 4 hours as needed (incontinence)., Disp: 30 tablet, Rfl: 6  .  Hypromellose (ARTIFICIAL TEARS) 0.4 % Ophthalmic Solution, Place 1 drop in each EYE 4 times a day as needed for dry eyes., Disp: 5 mL, Rfl: 11  .  Ibuprofen (ADVIL OR), , Disp: , Rfl:   .  MAGNESIUM OR, Take by mouth., Disp: , Rfl:   .  meclizine 12.5 MG Oral Tab, Take 1 tablet (12.5 mg) by mouth 3 times a day as needed for dizziness., Disp: 60 tablet, Rfl: 5    Review of patient's allergies indicates:  Allergies   Allergen Reactions   . Morphine     . Penicillins    . Tramadol        Past Medical History:   Diagnosis Date   . Headache    . MS (multiple sclerosis) (HCC)    . Osteoporosis        No past surgical history on file.    Social History     Socioeconomic History   . Marital status: Divorced     Spouse name: Not on file   . Number of children: Not on file   .  Years of education: Not on file   . Highest education level: Not on file   Occupational History   . Not on file   Social Needs   . Financial resource strain: Not on file   . Food insecurity:     Worry: Not on file     Inability: Not on file   . Transportation needs:     Medical: Not on file     Non-medical: Not on file   Tobacco Use   . Smoking status: Never Smoker   . Smokeless tobacco: Never Used   Substance and Sexual Activity   . Alcohol use: No   . Drug use: No   . Sexual activity: Never   Lifestyle   . Physical activity:     Days per week: Not on file     Minutes per session: Not on file   . Stress: Not on file   Relationships   . Social connections:     Talks on phone: Not on file     Gets together: Not on file     Attends religious service: Not on file     Active member of club or organization: Not on file     Attends meetings of clubs or organizations: Not on file     Relationship status: Not on file   . Intimate partner violence:     Fear of current or ex partner: Not on file     Emotionally abused: Not on file     Physically abused: Not on file     Forced sexual activity: Not on file   Other Topics Concern   . Not on file   Social History Narrative    Spanish speaking. 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 Moved from [place] to [place] with her daughter.        Family History        Negative family history of: Multiple Sclerosis          Physical exam  ***  Constit: NAD, seated in office chair.  Eyes: conjunctiva clear.   ENMT: speech clear  Resp: regular respirations, no acc muscle use  Mental status: alert, answers questions  appropriately.  Psych: pleasant, smiling    Neuro:  -phalens: reports increased L 3rd-5th digit numbness  -pinprick: decreased L 3rd-5th digits.  -gait:  Walks without assistive device, balance improved  Tandem gait--significant trunk sway but able to do.  -motor:  Right  SAB5  EF5  EE5  Grip5    Left  SAB5  EF5  EE5  Grip5-    Right  HF4+  HAbd4+  KE5  DF5  PF5    Left  HF4  HAbd4+  KE5  DF5  PF5      Assessment/Plan:  58 yo F with relapsing remitting multiple sclerosis affecting brain and spine, not on DMT.    Multiple sclerosis  -doing well, balance improved, pain improved.  -only concern is left hand seems weaker.    Vertigo  -I think Dr. Scot Jun Geldern's thought that vertigo was due to vertiginous migraines was correct.  -on increased duloxetine 60 mg daily, pt is doing MUCH better, balance and dizziness both better.    Arthritis, chronic low back pain  -reports pain much better.  -on celebrex, takes 2 pills at once, uses rarely. Denies stomach issues. Knows to take with food. Requests 200 mg instead of 100 mg pill  -Rx celebrex  200 mg prn given.    Left hand numbness  -dicussed differential MS vs. Cubital tunnel syndrome  -referred for nerve conduction study [phone]    Osteopenia  -decreased bone density seen on XR.   -discussed need to get DEXA scan, will refer to PCP to treat vs. Bone health specialist if osteoporosis seen.  [phone]  [facility])    Follow up 3 mo    Histories, medications and problem list have been personally reviewed and updated as appropriate: YES.    *** min face-to-face time spent, >50% in counseling and care coordination. Please see above assessment and plan for topics addressed. Plan above was discussed in detail with patient.

## 2018-12-04 ENCOUNTER — Ambulatory Visit: Payer: Self-pay | Attending: Physical Medicine & Rehabilitation | Admitting: Physical Medicine & Rehabilitation

## 2018-12-04 ENCOUNTER — Encounter (HOSPITAL_BASED_OUTPATIENT_CLINIC_OR_DEPARTMENT_OTHER): Payer: Self-pay | Admitting: Physical Medicine & Rehabilitation

## 2018-12-04 VITALS — BP 109/55 | HR 69 | Temp 98.2°F | Ht 62.0 in | Wt 122.9 lb

## 2018-12-04 DIAGNOSIS — J45909 Unspecified asthma, uncomplicated: Secondary | ICD-10-CM

## 2018-12-04 DIAGNOSIS — M810 Age-related osteoporosis without current pathological fracture: Secondary | ICD-10-CM

## 2018-12-04 DIAGNOSIS — M792 Neuralgia and neuritis, unspecified: Secondary | ICD-10-CM

## 2018-12-04 DIAGNOSIS — Z6822 Body mass index (BMI) 22.0-22.9, adult: Secondary | ICD-10-CM

## 2018-12-04 DIAGNOSIS — M1990 Unspecified osteoarthritis, unspecified site: Secondary | ICD-10-CM

## 2018-12-04 DIAGNOSIS — G35 Multiple sclerosis: Secondary | ICD-10-CM

## 2018-12-04 MED ORDER — DULOXETINE HCL 60 MG OR CPEP
60.0000 mg | DELAYED_RELEASE_CAPSULE | Freq: Every day | ORAL | 5 refills | Status: DC
Start: 2018-12-04 — End: 2020-02-11

## 2018-12-04 MED ORDER — GABAPENTIN 100 MG OR CAPS
100.0000 mg | ORAL_CAPSULE | Freq: Every evening | ORAL | 11 refills | Status: DC
Start: 2018-12-04 — End: 2020-02-11

## 2018-12-04 MED ORDER — CELECOXIB 200 MG OR CAPS
200.0000 mg | ORAL_CAPSULE | Freq: Every day | ORAL | 6 refills | Status: DC | PRN
Start: 2018-12-04 — End: 2020-02-11

## 2018-12-04 NOTE — Patient Instructions (Addendum)
Recomiendo que veas al mdico de salud sea   [phone]  tiene osteoporosis    -celebrex 200 mg, #30    -gabapentin 100 mg por la noche, #30    -duloxetine 60 mg una vez por dia    -mdico de atencin primaria [phone] para tos y asma    -la vacuna contra la gripe no Radiographer, therapeutic a la gente la gripe. No es Engineer, production.

## 2018-12-04 NOTE — Progress Notes (Signed)
Physical Medicine and Rehabilitation Note    Chief Complaint: multiple sclerosis  Spanish phone interpreter used.    Background neuro history (saw Dr. Elenor Legato):   Diagnosis of RRMS: 2004 based on headaches, balance problems, double vision, MRI brain; no spinal cord lesions; LP neg  Current DMT: None  Past DMT: Betaseron 2004-2010 (stopped after prior neurologist moved away and concern that this is not relapsing MS).    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 headaches. Happened frequently before starting to Betaseron, less while on the drug.         Had weakness and inability to breath on IV steroids x 3 days (?) before  Imaging:    MRI Brain/C/T spine stable 11/2017     MRI brain 01/18: As compared to 11/26/2013, multiple grossly unchanged foci of white matter signal abnormality within the brain, accounting for differences in technique. Relatively nonspecific and could be seen in setting of chronic ischemic vascular disease or demyelinating disease. No abnormal enhancement detected. No PML.     MRI brain 02/15: Stable white matter lesions periventricular and pericallosal.    MRI C spine 01/18: No cervical cord lesions. Stable focal T2 prolongation involving the thoracic spine at T2-T3 seen only on axial imaging and not well correlated on sagittal images. No new lesion or abnormal cord enhancement detected.     MRI C and T spine 04/15: No spinal cord lesions. Hyperintense lesion lower pole right kidney. C4-5 disc protrusion, effaces ventral thecal sac but does not deform the cord    Co-morbidities: positive anti-SSA, osteoporosis (DEXA 10/2018), asthma    SH: Spanish speaking. Lives w daughter and son-in-law    Results:  -02/2018 XR low back-mild grade 1 anterolisthesis of L5 on S1 and probable osteopenia  -11/09/2018 EMG did not show L median/ulnar neuropathy or C8  radic    Interval history:   -last seen 05/2018. At that time referred for DEXA scan, refilled celebrex 200 mg daily, referred for NCV/EMG testing of left hand numbness    In interim-  -10/28/2018 [facility] ER for URI.  Felt very ill. Had very bad cough.  Notes still coughs, lungs not back to normal    -10/2018 DEXA scan showed osteoporosis. T score -2.9 at spine, L hip -1.8, L fem neck -2.5.  -11/09/2018 EMG did not show L median/ulnar neuropathy or C8 radic    -all over body, feels bones crack.    -L hand numbness is stable    -had stopped gabapentin, duloxetine when she was sick. Now needs refill.    -currently only taking celebrex 200 mg at night. No other meds.  Was not able to pick up the refill last time, too expensive as a 90 day supply ($400)    -in past had asthma "crisis"  Nebulizer helped in the past.      ---------------  Background rehab history:  -Mobility: L arm/leg weakness. does lose balance esp crouching (at times falls forward/backwards). Vertigo, eye movements exacerbate, some migrainous component.  -ADLS: L hand numbness, easier to drop things from L hand. ADLs/IADLs take longer. Sits for showering.  -Equipment: no AD.  -Speech/swallow/Respiratory:   -Therapy:   -Exercise:    -Fatigue: yes.    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    -left side decreased temperature sense. L hand 3rd-5th fingers altered sensation  -Pain: chronic migraine. Ice helps. Celebrex 200 mg/ibuprofen prn. L-sided neuropathic pain. Joint pains. Gabapentin 100 mg qhs. Duloxetine 60 mg daily.  Past: nortriptyline (made her feel bad, felt short of breath)  -Spasticity: muscle pains likely neuropathic    -Skin:    -Bowel:   -Bladder: Urinary urgency. urinary incontinence. Nocturia. Uses pads.  Saw urogyn Dr. Elon SpannerFialkow  Past: tolterodine 2 mg bid (was too expensive, didn't take), oxybutynin (didn't work), solifenacin (too  expensive)  03/14/17 voided 200 mL, PVR 0 mL.  -gyn: removal of R ovary and half of L ovary for R ovarian cyst. 1987 hysterectomy.    -Vision: 03/2018 saw Dr. Rogelia BogaNicholas Chan (resident, ophthal)-full OCTs, Left pterygium causing astigmatism, 20/20, dry eyes. Follow up 6 months.      Review of Systems  6025ft Walk Exam  Time (sec): 7.47  Mobility: No Mobility Aids    Symptoms  Fatigue: A little bit  Sleep: Not at all  Double vision: Not at all  Blurry vision: Quite a bit  Swallowing problems: A little bit  Dizziness / light headedness: Very much  Numbness or tingling or odd sensations: Quite a bit  Pain: Not at all  Weakness: Quite a bit  Falling: Not at all  Spasms or jerking: Not at all  Tightness or stiffness: Not at all  Bladder problems: A little bit  Bowel problems: Not at all  Sexual problems: Not at all(N/A)  Depression: Not at all  Anxiety: Not at all  Problems thinking: A little bit  Heat sensitivity: Quite a bit  Skin problems (e.g., injection site reaction): Not at all  Heart palpitations: A little bit  Shortness of breath: Quite a bit        Current Outpatient Medications:   .  BIOTIN OR, Take by mouth., Disp: , Rfl:   .  celecoxib 200 MG capsule, Take 1 capsule (200 mg) by mouth daily as needed for pain. Take with food., Disp: 30 capsule, Rfl: 6  .  Cholecalciferol (VITAMIN D) 2000 units Oral Cap, Take 1 capsule (2,000 Units) by mouth daily., Disp: 90 capsule, Rfl: 3  .  DULoxetine 60 MG DR capsule, Take 1 capsule (60 mg) by mouth daily., Disp: 30 capsule, Rfl: 5  .  Ergocalciferol (VITAMIN D OR), Take by mouth., Disp: , Rfl:   .  gabapentin 100 MG capsule, Take 1 capsule (100 mg) by mouth at bedtime., Disp: 30 capsule, Rfl: 11  .  Gabapentin 100 MG Oral Cap, Take 1-3 capsules at night and 1 capsule in the morning., Disp: 360 capsule, Rfl: 3  .  Hypromellose (ARTIFICIAL TEARS) 0.4 % Ophthalmic Solution, Place 1 drop in each EYE 4 times a day as needed for dry eyes. (Patient not taking: Reported on 12/04/2018),  Disp: 5 mL, Rfl: 11  .  Ibuprofen (ADVIL OR), , Disp: , Rfl:   .  MAGNESIUM OR, Take by mouth., Disp: , Rfl:   .  meclizine 12.5 MG Oral Tab, Take 1 tablet (12.5 mg) by mouth 3 times a day as needed for dizziness. (Patient not taking: Reported on 12/04/2018), Disp: 60 tablet, Rfl: 5    Review of patient's allergies indicates:  Allergies   Allergen Reactions   . Morphine    . Penicillins    . Tramadol        Past Medical History:  Diagnosis Date   . Headache    . MS (multiple sclerosis) (HCC)    . Osteoporosis        No past surgical history on file.    Social History     Socioeconomic History   . Marital status: Divorced     Spouse name: Not on file   . Number of children: Not on file   . Years of education: Not on file   . Highest education level: Not on file   Occupational History   . Not on file   Social Needs   . Financial resource strain: Not on file   . Food insecurity     Worry: Not on file     Inability: Not on file   . Transportation needs     Medical: Not on file     Non-medical: Not on file   Tobacco Use   . Smoking status: Never Smoker   . Smokeless tobacco: Never Used   Substance and Sexual Activity   . Alcohol use: No   . Drug use: No   . Sexual activity: Never   Lifestyle   . Physical activity     Days per week: Not on file     Minutes per session: Not on file   . Stress: Not on file   Relationships   . Social Wellsite geologist on phone: Not on file     Gets together: Not on file     Attends religious service: Not on file     Active member of club or organization: Not on file     Attends meetings of clubs or organizations: Not on file     Relationship status: Not on file   . Intimate partner violence     Fear of current or ex partner: Not on file     Emotionally abused: Not on file     Physically abused: Not on file     Forced sexual activity: Not on file   Other Topics Concern   . Not on file   Social History Narrative    Spanish speaking. 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        Family History        Negative family history of: Multiple Sclerosis          Physical exam  Vitals:    12/04/18 0920   BP: 109/55   BP Cuff Size: Regular   BP Site: Right Arm   BP Position: Sitting   Pulse: 69   Temp: 98.2 F (36.8 C)   TempSrc: Temporal   Weight: 122 lb 14.4 oz (55.7 kg)   Height: 5\' 2"  (1.575 m)     Constit: NAD, seated in office chair.  Eyes: conjunctiva clear.   ENMT: speech clear  Resp: regular respirations, no acc muscle use  Mental status: alert, answers questions appropriately.  Psych: pleasant, smiling      Assessment/Plan:  58 yo F with relapsing remitting multiple sclerosis affecting brain and spine, not on DMT.    Multiple sclerosis  -doing well overall  -she notes her left hand symptoms are stable.  -she was sick with a viral illness x 3 weeks. This was not the influenza.  Encouraged her to get flu shot. Discussed flu shot is not a live virus. However, pt declined.    Arthritis  -  celebrex ordered but just for 1 mo supply. Pt notes that 3 mo supply cost prohibitive.    Neuropathic pain  -she had stopped taking gabapentin and duloxetine during a viral illness but plans to resume.  -refills provided for: gabapentin 100 mg at night, duloxetine 60 mg daily    Asthma  -she continues to cough and notes funny sensation in chest  -notes asthma attacks in the past.  We discussed asthma indeed has a cough variant.  Advised for her to see primary care doctor for asthma management    Osteoporosis  -we discussed her osteoporosis. She is only 58 years old. Did have a one time 1 gram solumedrol daily x 3 days exposure for MS relapse management. However, not sure if there are other predisposing factors.  Referred her to bone health    Follow up 4 months    Histories, medications and problem list have been personally reviewed and updated as appropriate: YES.    40 min face-to-face time spent, >50% in counseling and care  coordination. Please see above assessment and plan for topics addressed. Plan above was discussed in detail with patient.

## 2018-12-29 ENCOUNTER — Ambulatory Visit (INDEPENDENT_AMBULATORY_CARE_PROVIDER_SITE_OTHER): Payer: Self-pay | Admitting: Physician Assistant

## 2018-12-29 ENCOUNTER — Encounter (INDEPENDENT_AMBULATORY_CARE_PROVIDER_SITE_OTHER): Payer: Self-pay | Admitting: Physician Assistant

## 2018-12-29 VITALS — BP 140/68 | HR 85 | Temp 98.8°F | Resp 15 | Wt 119.0 lb

## 2018-12-29 DIAGNOSIS — Z0000 Encounter for general adult medical examination without abnormal findings: Secondary | ICD-10-CM

## 2018-12-29 DIAGNOSIS — J4541 Moderate persistent asthma with (acute) exacerbation: Secondary | ICD-10-CM

## 2018-12-29 DIAGNOSIS — Z1239 Encounter for other screening for malignant neoplasm of breast: Secondary | ICD-10-CM

## 2018-12-29 DIAGNOSIS — Z1211 Encounter for screening for malignant neoplasm of colon: Secondary | ICD-10-CM

## 2018-12-29 DIAGNOSIS — Z1329 Encounter for screening for other suspected endocrine disorder: Secondary | ICD-10-CM

## 2018-12-29 DIAGNOSIS — Z6821 Body mass index (BMI) 21.0-21.9, adult: Secondary | ICD-10-CM

## 2018-12-29 DIAGNOSIS — Z13220 Encounter for screening for lipoid disorders: Secondary | ICD-10-CM

## 2018-12-29 MED ORDER — PREDNISONE 10 MG OR TABS
10.0000 mg | ORAL_TABLET | ORAL | 0 refills | Status: DC | PRN
Start: 2018-12-29 — End: 2020-06-28

## 2018-12-29 MED ORDER — ALBUTEROL SULFATE HFA 108 (90 BASE) MCG/ACT IN AERS
2.0000 | INHALATION_SPRAY | RESPIRATORY_TRACT | 2 refills | Status: DC | PRN
Start: 2018-12-29 — End: 2020-02-11

## 2018-12-29 NOTE — Progress Notes (Signed)
Vitals, allergies, and medication reviewed by Terri Piedra, CMA, 12/29/2018 7:58 AM      Reason for visit: establish care         Have you seen a specialist since your last visit: NO     If  Name and location and date.     HM Due:   Health Maintenance   Topic Date Due   . Pneumococcal Vaccine: Pediatrics (0-5 years) and At-Risk Patients (6-64 years) (1 of 1 - PPSV23) 10/06/1966   . Cervical Cancer Screening  10/06/1981   . Lipid Disorders Screening  10/06/2005   . Breast Cancer Screening  10/06/2010   . Zoster Vaccine (1 of 2) 10/06/2010   . Colorectal Cancer Screening  10/06/2010   . Influenza Vaccine (1) 07/21/2018   . Depression Screening (PHQ-2)  12/05/2019   . DTaP, Tdap, and Td Vaccines (2 - Td) 10/21/2022   . Hepatitis C Screening  Completed   . HIV Screening  Completed   . Hepatitis B Vaccine  Aged Out              Future Appointments   Date Time Provider Department Center   12/29/2018  8:00 AM [facility]   04/07/2019  8:00 AM UNASSIGNED OPHTH RES [facility]   04/09/2019  9:00 AM Dessa Phi, MD [facility]

## 2018-12-29 NOTE — Progress Notes (Signed)
Chief Complaint   Patient presents with   . Establish Care     was seen at [facility] for SOB and cough        HPI  Debra Torres is a 58 year old female presenting in clinic today for:    Establish Care  Reviewed and updated today:  Past Medical History  Medications  Allergies  Immunizations   Family History   Social History    WELLNESS    Current Diet: balanced, many vegetables, lean protein, no processed food   Exercise: regular exercise daily  Sleep:  Good, 7 hours per night  Energy: good  Alcohol: none  Smoking Hx: none    Other Concerns:  She is seeing neurology at [facility] for MS. She states that she still gets dizzy and headaches sometimes, but these are well controlled.    She was seen in [facility] ED for cough and short of breath. She had been diagnosed with asthma in the past. She was given an inhaler, she states this helped with asthma, but didn't resolve the symptoms completely. She still feels slightly short of breath, but much better.      She is due for breast cancer screen and colon cancer screen    Review of Systems   Constitutional: Negative.  Negative for chills, diaphoresis, fever, malaise/fatigue and weight loss.   HENT: Negative.    Eyes: Negative.    Respiratory: Positive for shortness of breath. Negative for cough.    Cardiovascular: Negative.  Negative for chest pain, palpitations and leg swelling.   Gastrointestinal: Negative for abdominal pain, blood in stool, constipation, diarrhea, heartburn, melena, nausea and vomiting.   Genitourinary: Negative.  Negative for flank pain and hematuria.   Musculoskeletal: Positive for joint pain and myalgias.   Neurological: Positive for dizziness. Negative for headaches.   Endo/Heme/Allergies: Negative for polydipsia. Does not bruise/bleed easily.   Psychiatric/Behavioral: Negative for depression and suicidal ideas. The patient is not nervous/anxious.         Patient Active Problem List   Diagnosis   . Multiple sclerosis (HCC)   . Chronic migraine without  aura without status migrainosus, not intractable   . Vertigo   . Impaired functional mobility, balance, and endurance   . Chronic fatigue   . Urgency incontinence   . Other osteoporosis without current pathological fracture      Outpatient Medications Prior to Visit   Medication Sig Dispense Refill   . BIOTIN OR Take by mouth.     . celecoxib 200 MG capsule Take 1 capsule (200 mg) by mouth daily as needed for pain. Take with food. 30 capsule 6   . Cholecalciferol (VITAMIN D) 2000 units Oral Cap Take 1 capsule (2,000 Units) by mouth daily. 90 capsule 3   . DULoxetine 60 MG DR capsule Take 1 capsule (60 mg) by mouth daily. 30 capsule 5   . Ergocalciferol (VITAMIN D OR) Take by mouth.     . gabapentin 100 MG capsule Take 1 capsule (100 mg) by mouth at bedtime. 30 capsule 11   . Gabapentin 100 MG Oral Cap Take 1-3 capsules at night and 1 capsule in the morning. 360 capsule 3   . Hypromellose (ARTIFICIAL TEARS) 0.4 % Ophthalmic Solution Place 1 drop in each EYE 4 times a day as needed for dry eyes. (Patient not taking: Reported on 12/04/2018) 5 mL 11   . Ibuprofen (ADVIL OR)      . MAGNESIUM OR Take by mouth.     Marland Kitchen  meclizine 12.5 MG Oral Tab Take 1 tablet (12.5 mg) by mouth 3 times a day as needed for dizziness. (Patient not taking: Reported on 12/04/2018) 60 tablet 5     No facility-administered medications prior to visit.        Social History     Socioeconomic History   . Marital status: Divorced     Spouse name: Not on file   . Number of children: Not on file   . Years of education: Not on file   . Highest education level: Not on file   Occupational History   . Not on file   Social Needs   . Financial resource strain: Not on file   . Food insecurity     Worry: Not on file     Inability: Not on file   . Transportation needs     Medical: Not on file     Non-medical: Not on file   Tobacco Use   . Smoking status: Never Smoker   . Smokeless tobacco: Never Used   Substance and Sexual Activity   . Alcohol use: No   . Drug  use: No   . Sexual activity: Never   Lifestyle   . Physical activity     Days per week: Not on file     Minutes per session: Not on file   . Stress: Not on file   Relationships   . Social Wellsite geologist on phone: Not on file     Gets together: Not on file     Attends religious service: Not on file     Active member of club or organization: Not on file     Attends meetings of clubs or organizations: Not on file     Relationship status: Not on file   . Intimate partner violence     Fear of current or ex partner: Not on file     Emotionally abused: Not on file     Physically abused: Not on file     Forced sexual activity: Not on file   Other Topics Concern   . Not on file   Social History Narrative    Spanish speaking. 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 Moved from [place] to [place] with her daughter.       Family History     Problem (# of Occurrences) Relation (Name,Age of Onset)    Cancer (1) Sister: cervical cancer    No Known Problems (2) Mother, Father       Negative family history of: Multiple Sclerosis           OBJECTIVE  Vitals:    12/29/18 0755 12/29/18 0756   BP: (!) 140/68    BP Site: Right Arm    BP Position: Sitting    Pulse: 85    Resp: 15    Temp: 98.8 F (37.1 C)    TempSrc: Temporal    SpO2: 98%    Weight: 119 lb (54 kg) 119 lb (54 kg)      Physical Exam   Constitutional: She is oriented to person, place, and time and well-developed, well-nourished, and in no distress. No distress.   HENT:   Head: Normocephalic and atraumatic.   Mouth/Throat: Oropharynx is clear and moist. No oropharyngeal exudate.   Eyes: Pupils are equal, round, and reactive to light. EOM are normal.   Neck: Normal range of motion. Neck supple. No  thyromegaly present.   Cardiovascular: Normal rate, regular rhythm, normal heart sounds and intact distal pulses. Exam reveals no gallop and no friction rub.   No murmur heard.  Pulmonary/Chest: Effort normal and breath sounds normal.  No respiratory distress. She has no wheezes. She has no rales. She exhibits no tenderness.   Abdominal: Soft. Bowel sounds are normal. She exhibits no distension and no mass. There is no abdominal tenderness. There is no rebound and no guarding.   Musculoskeletal: Normal range of motion.         General: No tenderness, deformity or edema.   Lymphadenopathy:     She has no cervical adenopathy.   Neurological: She is alert and oriented to person, place, and time. She displays normal reflexes. No cranial nerve deficit. Gait normal. Coordination normal.   Skin: Skin is warm and dry. She is not diaphoretic.   Psychiatric: Mood, memory, affect and judgment normal.         ASSESSMENT/PLAN  1. Breast cancer screening  - MAM MAMMOGRAPHY SCREEN W/TOMO BILAT; Future    2. Colon cancer screening  - Occult Blood By IA, Stool; Future    3. Moderate persistent asthma with acute exacerbation  - albuterol HFA 108 (90 Base) MCG/ACT inhaler; Inhale 2 puffs by mouth every 4 hours as needed for shortness of breath/wheezing.  Dispense: 1 Inhaler; Refill: 2  - predniSONE 10 MG tablet; Take 1 tablet (10 mg) by mouth as needed (asthma) for up to 30 doses.  Dispense: 30 tablet; Refill: 0    4. Lipid screening  - Lipid Panel    5. Thyroid disorder screen  - Thyroid Stimulating Hormone    6. Routine general medical examination at a health care facility  -no abnormalities found on exam  -keep up good work with diet and physical activity    7. Encounter for medical examination to establish care

## 2018-12-30 LAB — LIPID PANEL
Cholesterol (LDL): 86 mg/dL (ref <130)
Cholesterol/HDL Ratio: 3.2
HDL Cholesterol: 51 mg/dL (ref >39)
Non-HDL Cholesterol: 111 mg/dL (ref 0–159)
Total Cholesterol: 162 mg/dL (ref <200)
Triglyceride: 123 mg/dL (ref <150)

## 2018-12-30 LAB — THYROID STIMULATING HORMONE: Thyroid Stimulating Hormone: 1.069 u[IU]/mL (ref 0.400–5.000)

## 2019-01-07 ENCOUNTER — Other Ambulatory Visit (INDEPENDENT_AMBULATORY_CARE_PROVIDER_SITE_OTHER): Payer: Self-pay | Admitting: Physician Assistant

## 2019-01-07 DIAGNOSIS — Z1211 Encounter for screening for malignant neoplasm of colon: Secondary | ICD-10-CM

## 2019-01-08 LAB — OCCULT BLOOD BY IA, STL: Occult Bld 1 Result: NEGATIVE

## 2019-04-07 ENCOUNTER — Encounter (HOSPITAL_BASED_OUTPATIENT_CLINIC_OR_DEPARTMENT_OTHER): Payer: Self-pay

## 2019-04-09 ENCOUNTER — Encounter (HOSPITAL_BASED_OUTPATIENT_CLINIC_OR_DEPARTMENT_OTHER): Payer: Self-pay | Admitting: Physical Medicine & Rehabilitation

## 2019-04-09 ENCOUNTER — Ambulatory Visit: Payer: Self-pay | Attending: Physical Medicine & Rehabilitation | Admitting: Physical Medicine & Rehabilitation

## 2019-04-09 VITALS — BP 117/69 | HR 67 | Temp 98.7°F | Ht 62.0 in | Wt 124.0 lb

## 2019-04-09 DIAGNOSIS — G35 Multiple sclerosis: Secondary | ICD-10-CM | POA: Insufficient documentation

## 2019-04-09 DIAGNOSIS — M810 Age-related osteoporosis without current pathological fracture: Secondary | ICD-10-CM | POA: Insufficient documentation

## 2019-04-09 DIAGNOSIS — R42 Dizziness and giddiness: Secondary | ICD-10-CM

## 2019-04-09 LAB — URINALYSIS WITH REFLEX CULTURE
Bacteria, URN: NONE SEEN
Bilirubin (Qual), URN: NEGATIVE
Epith Cells_Renal/Trans,URN: NEGATIVE /HPF
Epith Cells_Squamous, URN: NEGATIVE /LPF
Glucose Qual, URN: NEGATIVE mg/dL
Ketones, URN: NEGATIVE mg/dL
Leukocyte Esterase, URN: NEGATIVE
Nitrite, URN: NEGATIVE
Occult Blood, URN: NEGATIVE
Protein (Alb Semiquant), URN: NEGATIVE mg/dL
RBC, URN: NEGATIVE /HPF
Specific Gravity, URN: 1.008 g/mL (ref 1.006–1.027)
WBC, URN: NEGATIVE /HPF
pH, URN: 7 (ref 5.0–8.0)

## 2019-04-09 MED ORDER — METHYLPREDNISOLONE 4 MG OR TBPK
ORAL_TABLET | ORAL | 0 refills | Status: DC
Start: 2019-04-09 — End: 2019-05-14

## 2019-04-09 NOTE — Patient Instructions (Addendum)
Imagen de Resonancia Magnetica    Si tiene un crisis, toma esteroides y necesita visita el neurologo  25 pills (13 breakfast, 12 lunch) x 3 dias.    Si es necesario para el migrania, podria tomar una dosis mas baja de esteroides     Cita Dr. Legrand Rams y hablar de osteoporosis.    prueba de [place]

## 2019-04-09 NOTE — Progress Notes (Signed)
Physical Medicine and Rehabilitation Note    Chief Complaint: vertigo, feeling generally weak, vision blurry  Spanish phone interpreter used.    Background neuro history (saw Dr. Elenor LegatoVon Geldern):   Diagnosis of RRMS: 2004 based on headaches, balance problems, double vision, MRI brain; no spinal cord lesions; LP neg  Current DMT: None  Past DMT: Betaseron 2004-2010 (stopped after prior neurologist moved away and concern that this is not relapsing MS).    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 headaches. Happened frequently before starting to Betaseron, less while on the drug.         Had weakness and inability to breath on IV steroids x 3 days (?) before  Imaging:    MRI Brain/C/T spine stable 11/2017     MRI brain 01/18: As compared to 11/26/2013, multiple grossly unchanged foci of white matter signal abnormality within the brain, accounting for differences in technique. Relatively nonspecific and could be seen in setting of chronic ischemic vascular disease or demyelinating disease. No abnormal enhancement detected. No PML.     MRI brain 02/15: Stable white matter lesions periventricular and pericallosal.    MRI C spine 01/18: No cervical cord lesions. Stable focal T2 prolongation involving the thoracic spine at T2-T3 seen only on axial imaging and not well correlated on sagittal images. No new lesion or abnormal cord enhancement detected.     MRI C and T spine 04/15: No spinal cord lesions. Hyperintense lesion lower pole right kidney. C4-5 disc protrusion, effaces ventral thecal sac but does not deform the cord    Co-morbidities: positive anti-SSA, osteoporosis (DEXA 10/2018, T score -2.9 at spine, L hip -1.8, L fem neck -2.5), asthma    SH: Spanish speaking. Lives w daughter and son-in-law. Only son in law working. Daughter has a young daughter.    Results:  -02/2018 XR  low back-mild grade 1 anterolisthesis of L5 on S1 and probable osteopenia  -11/09/2018 EMG did not show L median/ulnar neuropathy or C8 radic      Interval history:   -last seen 11/2018. At that time suggested seeing bone health doctor, refilled celebrex/gabapentin/duloxetine, advised to see PCP for asthma    In interim-  -12/2018 established care with PCP and given albuterol INH and  Prednisone prn    -this weekend hard to move. Didn't feel well.  Feels weak  Hard to see, hard to read. Vision is blurry. Color different  Head hurts.   Hard to eat, hard to chew  Usually this sort of symptoms resolves in 3 days, recently lasting longer/persisting    -taking celebrex  Gabapentin  Duloxetine 1 pill a day  Gabapentin 1 pill at night  -meclizine not helping    -has AC  -staying indoors, does get heat sensitive    -feels very stressed  -money has been difficult  -only son in law working. Daughter taking care of daughter.     -feels may fall backwards    -denies sore throat, dysuria.     -left side weak.  Left leg interferes with walking.       --------------  Background rehab history:  -Mobility: L arm/leg weakness. does lose balance esp crouching (at times falls forward/backwards). Vertigo, eye movements exacerbate, some migrainous component.  -ADLS: L hand numbness, easier to drop things from L hand. ADLs/IADLs take longer. Sits for showering.  -Equipment:  no AD.  -Speech/swallow/Respiratory:   -Therapy:   -Exercise:    -heat sensitive  -Fatigue: yes.    Past: was afraid to try amantadine  -Sleep:   -Mood:     -Cognition:  -Work: used to work in a [employer]. Currently unable to work due to health problems.    -left side decreased temperature sense. L hand 3rd-5th fingers altered sensation  -Pain: chronic migraine. Ice helps. Celebrex 200 mg/ibuprofen prn. L-sided neuropathic pain. Joint pains. Gabapentin 100 mg qhs. Duloxetine 60 mg daily.  Past: nortriptyline (made her feel bad, felt short of breath)  -Spasticity: muscle  pains likely neuropathic    -Skin:    -Bowel:   -Bladder: Urinary urgency. urinary incontinence. Nocturia. Uses pads.  Saw urogyn Dr. Kandis Cocking  Past: tolterodine 2 mg bid (was too expensive, didn't take), oxybutynin (didn't work), solifenacin (too expensive)  03/14/17 voided 200 mL, PVR 0 mL.  -gyn: removal of R ovary and half of L ovary for R ovarian cyst. 1987 hysterectomy.    -Vision: 03/2018 saw Dr. Waldon Reining (resident, ophthal)-full OCTs, Left pterygium causing astigmatism, 20/20, dry eyes. Follow up 6 months.      Review of Systems  26ft Walk Exam  Time (sec): 9.19  Mobility: No Mobility Aids    Symptoms  Fatigue: Quite a bit  Sleep: Not at all  Double vision: Not at all  Blurry vision: Very much  Swallowing problems: A little bit  Dizziness / light headedness: Very much  Numbness or tingling or odd sensations: A little bit  Pain: Very much  Weakness: Quite a bit  Falling: Not at all  Spasms or jerking: A little bit  Tightness or stiffness: Not at all  Bladder problems: A little bit  Bowel problems: Not at all  Sexual problems: Not at all  Depression: Not at all  Anxiety: A little bit  Problems thinking: Not at all  Heat sensitivity: Very much  Skin problems (e.g., injection site reaction): A little bit  Heart palpitations: Quite a bit  Shortness of breath: A little bit    Exercise Vitals:           Current Outpatient Medications:   .  albuterol HFA 108 (90 Base) MCG/ACT inhaler, Inhale 2 puffs by mouth every 4 hours as needed for shortness of breath/wheezing., Disp: 1 Inhaler, Rfl: 2  .  BIOTIN OR, Take by mouth., Disp: , Rfl:   .  celecoxib 200 MG capsule, Take 1 capsule (200 mg) by mouth daily as needed for pain. Take with food., Disp: 30 capsule, Rfl: 6  .  Cholecalciferol (VITAMIN D) 2000 units Oral Cap, Take 1 capsule (2,000 Units) by mouth daily., Disp: 90 capsule, Rfl: 3  .  DULoxetine 60 MG DR capsule, Take 1 capsule (60 mg) by mouth daily., Disp: 30 capsule, Rfl: 5  .  Ergocalciferol (VITAMIN D OR),  Take by mouth., Disp: , Rfl:   .  gabapentin 100 MG capsule, Take 1 capsule (100 mg) by mouth at bedtime., Disp: 30 capsule, Rfl: 11  .  Gabapentin 100 MG Oral Cap, Take 1-3 capsules at night and 1 capsule in the morning., Disp: 360 capsule, Rfl: 3  .  Ibuprofen (ADVIL OR), , Disp: , Rfl:   .  MAGNESIUM OR, Take by mouth., Disp: , Rfl:   .  methylPREDNISolone 4 MG tablet dose pak, Take by mouth as directed on package labeling for duration of 6 days., Disp: 21 tablet, Rfl: 0  .  predniSONE  10 MG tablet, Take 1 tablet (10 mg) by mouth as needed (asthma) for up to 30 doses., Disp: 30 tablet, Rfl: 0    Review of patient's allergies indicates:  Allergies   Allergen Reactions   . Morphine    . Penicillins    . Tramadol        Past Medical History:   Diagnosis Date   . Asthma    . Headache    . MS (multiple sclerosis) (HCC)    . Osteoporosis        Past Surgical History:   Procedure Laterality Date   . ANES; ANESTH, CS HYSTERECTOMY  1987   . CHOLECYSTECTOMY  2013       Social History     Socioeconomic History   . Marital status: Divorced     Spouse name: Not on file   . Number of children: Not on file   . Years of education: Not on file   . Highest education level: Not on file   Occupational History   . Not on file   Social Needs   . Financial resource strain: Not on file   . Food insecurity     Worry: Not on file     Inability: Not on file   . Transportation needs     Medical: Not on file     Non-medical: Not on file   Tobacco Use   . Smoking status: Never Smoker   . Smokeless tobacco: Never Used   Substance and Sexual Activity   . Alcohol use: No   . Drug use: No   . Sexual activity: Never   Lifestyle   . Physical activity     Days per week: Not on file     Minutes per session: Not on file   . Stress: Not on file   Relationships   . Social Wellsite geologistconnections     Talks on phone: Not on file     Gets together: Not on file     Attends religious service: Not on file     Active member of club or organization: Not on file     Attends  meetings of clubs or organizations: Not on file     Relationship status: Not on file   . Intimate partner violence     Fear of current or ex partner: Not on file     Emotionally abused: Not on file     Physically abused: Not on file     Forced sexual activity: Not on file   Other Topics Concern   . Not on file   Social History Narrative    Spanish speaking. 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 [place] with [place].        Family History     Problem (# of Occurrences) Relation (Name,Age of Onset)    Cancer (1) Sister: cervical cancer    No Known Problems (2) Mother, Father       Negative family history of: Multiple Sclerosis          Physical exam  Vitals:    04/09/19 0902   BP: 117/69   BP Cuff Size: Regular   BP Site: Left Arm   BP Position: Sitting   Pulse: 67   Temp: 98.7 F (37.1 C)   TempSrc: Temporal   Weight: 124 lb (56.2 kg)   Height: 5\' 2"  (1.575 m)  Constit: NAD, seated in office chair.  Eyes: conjunctiva clear. 20/30 bilaterally. Feels better when lights are dimmed. Notes L eye reds look pink.  ENMT: speech clear  Resp: regular respirations, no acc muscle use  Mental status: alert, answers questions appropriately.  Psych: notes stress  Neuro:  -gait: tentative though able to walk in room without assistive device or loss of balance  -strength:  Right  SAB4+  EF5  EE5  Grip5    Left  SAB4+  EF5  EE5  Grip5    Right  HF4  HAbd5  KE5  DF5  PF5    Left  HF4-  HAbd5  KE5  DF5  PF5        Assessment/Plan:  58 yo F with relapsing remitting multiple sclerosis affecting brain and spine, not on DMT.    Multiple sclerosis  -currently a complicated picture. Notes generalized weakness, blurry vision, vertigo, hard to chew.  -not on disease modifying therapy, also overdue for neurology follow up   -may possibly be having an MS relapse vs. pseudorelapse vs. Migraine with vertigo.  -she has previously had vertiginous episodes, notes today's lasts  longer than most. In the past duloxetine reduced these episodes, suggesting migraine could be a precipitant.  -u/a did not show infection  However notes increased financial stressors as well as heat sensitivity which may contribute to potential pseudorelapse  -however, she notes left eye looks pink which could be optic neuritis that would be new.  -given etiology unclear will order MRI brain/C/T spine. Will not order IV steroids or high dose prednisone at this point given osteoporosis.  -I am unclear if pt's financial assistance program is still active. Will ask social worker Michiel CowboyJenny Hammer to confirm before pt actually goes for MRI to make sure do not cause undue economic hardship. SW may also discuss any financial resources that may be available.    Vertigo  -in the meantime while waiting for MRIs, provided Medrol dosepak for prn use if vertigo does not resolve and is intolerable.  -discussed it is possible if this is a migraine that it will self resolve in a few days. If it does not, could consider Medrol dosepak.  -will see her again in a month and will see at that time if we need to adjust prophylactic migraine meds    Osteoporosis  -this is not treated yet. Advised to see primary care doctor.    She has not seen Dr. Elenor LegatoVon Geldern for a year, will advise follow up     Follow up 1 month    Cc Dr. Elenor LegatoVon Geldern, SW Michiel CowboyJenny Hammer    Histories, medications and problem list have been personally reviewed and updated as appropriate: YES.    40 min face-to-face time spent, >50% in counseling and care coordination. Please see above assessment and plan for topics addressed. Plan above was discussed in detail with patient.

## 2019-04-13 ENCOUNTER — Encounter (HOSPITAL_BASED_OUTPATIENT_CLINIC_OR_DEPARTMENT_OTHER): Payer: Self-pay | Admitting: Clinical

## 2019-04-13 NOTE — Progress Notes (Signed)
04/13/19 [place] Work Note    Current Issue:  Social Work requested by Dr Rozetta Nunnery to look into status of patient's application for financial assistance    Action:  This Engineering geologist Counseling who indicated no application on file. Subsequently with use of interpreter called patient who indicated she had applied through [facility] but was told related to [place] there may be a delay in processing and she needed to wait until contacted. This Probation officer today returned email to [employer] to ask if they could look at [facility] system for application.  Received return email that [facility] received the application yesterday and to allow 14 days for processing.  Unclear to this writer why there was time delay in marking as received if patient submitted at an earlier date. This Probation officer possibly misunderstood patient and she did not actually submit the application until after speaking with this Probation officer yesterday.  Left f/u vm for patient today with use of interpreter to confirm date she submitted or mailed application. If there was significant delay in marking as received, this writer will attempt to have application expedited.    Plan:  Social Work standing by for return call, will place f/u call as needed

## 2019-04-14 ENCOUNTER — Encounter (HOSPITAL_BASED_OUTPATIENT_CLINIC_OR_DEPARTMENT_OTHER): Payer: Self-pay | Admitting: Physical Medicine & Rehabilitation

## 2019-04-14 DIAGNOSIS — R42 Dizziness and giddiness: Secondary | ICD-10-CM

## 2019-04-14 DIAGNOSIS — R519 Headache, unspecified: Secondary | ICD-10-CM

## 2019-04-14 MED ORDER — PROCHLORPERAZINE MALEATE 5 MG OR TABS
ORAL_TABLET | ORAL | 1 refills | Status: DC
Start: 2019-04-14 — End: 2019-08-16

## 2019-04-14 NOTE — Telephone Encounter (Signed)
Please call patient with Spanish interpreter.    I just had another similar patient with headache and vertigo who is apparently COVID19 positive.  I think she should be tested for COVID 19 due to her headache.    In addition, I will send compazine to her pharmacy to help with nausea part of it.

## 2019-04-14 NOTE — Telephone Encounter (Signed)
Called pt to discuss e-care message regarding her vertigo with interpreter assistance.    Pt states that over the weekend, her vertigo was very bad. She started the medrol dose pack yesterday.     She has been trying to do her exercises to help relieve the vertigo but it is now helping. She would like Dr Rozetta Nunnery to send in a Rx to help with her vertigo. Please advise.

## 2019-04-14 NOTE — Telephone Encounter (Signed)
Called and spoke with pt with help from an interpreter. This RN will send her the phone number to the COVID-19 scheduling line to set up her appt. She will p/u her medication from the pharmacy.

## 2019-04-15 ENCOUNTER — Ambulatory Visit (HOSPITAL_BASED_OUTPATIENT_CLINIC_OR_DEPARTMENT_OTHER): Payer: Self-pay

## 2019-04-15 ENCOUNTER — Other Ambulatory Visit
Admit: 2019-04-15 | Discharge: 2019-04-15 | Disposition: A | Discharge status: Home / Self Care | Payer: MEDICARE | Attending: Physical Medicine & Rehabilitation | Admitting: Physical Medicine & Rehabilitation

## 2019-04-15 DIAGNOSIS — U071 COVID-19: Secondary | ICD-10-CM | POA: Insufficient documentation

## 2019-04-15 LAB — COVID-19 CORONAVIRUS QUALITATIVE PCR: COVID-19 Coronavirus Qual PCR Result: DETECTED — AB

## 2019-04-16 ENCOUNTER — Telehealth (HOSPITAL_BASED_OUTPATIENT_CLINIC_OR_DEPARTMENT_OTHER): Payer: Self-pay

## 2019-04-16 NOTE — Telephone Encounter (Signed)
Was this patient enrolled into COVID-19 Connect? Yes     TESTING REASON: C/O VERTIGO.  SYMPTOMS: DENIES S/SX TODAY.  RECENT VERTIGO OVER THE WEEKEND RESOLVED.  LIVING ARRANGEMENTS: LIVES AT HOME WITH DAUGHTER, SON-IN LAW, GRANDCHILD.  ABILITY TO ISOLATE: IN BEDROOM AT HOME.  WORK: DOESN'T WORK OUTSIDE OF THE HOME.  PCP: PCP CURTIS Narda Rutherford PROVIDER: [place] DR.GLORIA HOU    With assist of [place], Spanish speaking patient was called for notification of a POSITIVE test result for COVID-19. The following information was given to the patient:    . The COVID-19 test result was positive  . Treatment of coronavirus does not require an antibiotic  . Remain isolated for 10 days at a minimum or 72 hours after your symptoms have completely resolved, whichever is longer.  . Wash hands often with soap and water for at least 20 seconds or alternatively use hand sanitizer with at least 60% alcohol content  . Cover coughs and sneezes  . Wear a mask when around others if possible  . Clean all "high-touch" surfaces every day, such as doorknobs and cellphones  . Continually monitor symptoms. Contact a medical provider if symptoms are worsening, such as difficulty breathing.   . Visit the [facility] COVID-19 Follow-up Instructions Page for additional information at [email or web address]    If the patient is a [facility] resident, do they need assistance with isolation? No    [facility] residents may be eligible for assistance with isolation, including temporary housing or other services. A phone referral by the medical team is needed to confirm the patient's positive COVID-19 status. Please call the [facility] at [phone] or visit [email or web address] for more information.     Services are available to provide support with basic needs to people in isolation or  quarantine so that they can stay in the home. Services include, but not limited to, assistance getting food and medical supplies, laundry, mail pick-up, and dog walking.     [facility] also has places for temporary isolation and quarantine for adults who need a safe place to isolate, quarantine and recover. Some common reasons include:  . Essential workers with a vulnerable person at home, such as those in health care, first responders, grocery/retail transportation, childcare, and warehousing  . People living in multigenerational households who need to protect high-risk elders and family members   . People in congregate living settings where it is hard to separate, such as shelters, group homes, dormitory/roommate living   . Travelers needing to isolate on arrival or during a lengthy stay   . People without permanent housing

## 2019-04-16 NOTE — Result Encounter Note (Signed)
Patient called for notification of Positive COVID-19 test result. See Telephone Encounter for additional details.

## 2019-04-27 ENCOUNTER — Encounter (HOSPITAL_BASED_OUTPATIENT_CLINIC_OR_DEPARTMENT_OTHER): Payer: Self-pay | Admitting: Clinical

## 2019-04-27 NOTE — Progress Notes (Signed)
04/27/19 Opp Social Work Note      Current Issue:  Social Work f/u on status of patient's financial assistance application.  Financial Counseling indicated processing not completed, should be completed by end of day tomorrow. Financial Counselor indicates she does not expect any issues with eligibility as application includes letter of support.      Plan:  Social Work remains available for further assistance as requested by patient or provider

## 2019-04-29 ENCOUNTER — Telehealth (HOSPITAL_BASED_OUTPATIENT_CLINIC_OR_DEPARTMENT_OTHER): Payer: Self-pay | Admitting: Physical Medicine & Rehabilitation

## 2019-04-29 NOTE — Telephone Encounter (Signed)
Opened in error

## 2019-04-30 ENCOUNTER — Ambulatory Visit: Payer: Self-pay | Attending: Physical Medicine & Rehabilitation

## 2019-04-30 ENCOUNTER — Ambulatory Visit (HOSPITAL_COMMUNITY): Payer: Self-pay

## 2019-04-30 ENCOUNTER — Ambulatory Visit (HOSPITAL_BASED_OUTPATIENT_CLINIC_OR_DEPARTMENT_OTHER): Payer: Self-pay

## 2019-04-30 ENCOUNTER — Encounter (HOSPITAL_BASED_OUTPATIENT_CLINIC_OR_DEPARTMENT_OTHER): Payer: Self-pay | Admitting: Physical Medicine & Rehabilitation

## 2019-04-30 DIAGNOSIS — G35 Multiple sclerosis: Secondary | ICD-10-CM

## 2019-04-30 DIAGNOSIS — G9589 Other specified diseases of spinal cord: Secondary | ICD-10-CM

## 2019-05-14 ENCOUNTER — Ambulatory Visit: Payer: Self-pay | Attending: Physical Medicine & Rehabilitation | Admitting: Physical Medicine & Rehabilitation

## 2019-05-14 ENCOUNTER — Encounter (HOSPITAL_BASED_OUTPATIENT_CLINIC_OR_DEPARTMENT_OTHER): Payer: Self-pay | Admitting: Physical Medicine & Rehabilitation

## 2019-05-14 VITALS — BP 123/56 | HR 64 | Temp 96.5°F | Ht 62.0 in | Wt 125.0 lb

## 2019-05-14 DIAGNOSIS — R42 Dizziness and giddiness: Secondary | ICD-10-CM

## 2019-05-14 DIAGNOSIS — G35 Multiple sclerosis: Secondary | ICD-10-CM

## 2019-05-14 NOTE — Patient Instructions (Addendum)
terapia fisica: [phone]    Vitamina:   Alpha lipoic acid 300 mg 2 veces por dia  (podria causar nausea)  augmenta 600 mg 2 veces por dia  (para proteger volumen de cerebro)    Va a mejorar poco a poco    Compazine podria causar fatiga. Toma solo si es necesario    MRIs son estables.

## 2019-05-14 NOTE — Progress Notes (Signed)
Physical Medicine and Rehabilitation Note    Chief Complaint: multiple sclerosis  Spanish phone interpreter used.    Background neuro history (saw Dr. Elenor LegatoVon Geldern):   Diagnosis of RRMS: 2004 based on headaches, balance problems, double vision, MRI brain; no spinal cord lesions; LP neg  Current DMT: None  Past DMT: Betaseron 2004-2010 (stopped after prior neurologist moved away and concern that this is not relapsing MS).    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 headaches. Happened frequently before starting to Betaseron, less while on the drug.         Had weakness and inability to breath on IV steroids x 3 days (?) before  Imaging:    MRI Brain/C/T spine stable 11/2017     MRI brain 01/18: As compared to 11/26/2013, multiple grossly unchanged foci of white matter signal abnormality within the brain, accounting for differences in technique. Relatively nonspecific and could be seen in setting of chronic ischemic vascular disease or demyelinating disease. No abnormal enhancement detected. No PML.     MRI brain 02/15: Stable white matter lesions periventricular and pericallosal.    MRI C spine 01/18: No cervical cord lesions. Stable focal T2 prolongation involving the thoracic spine at T2-T3 seen only on axial imaging and not well correlated on sagittal images. No new lesion or abnormal cord enhancement detected.     MRI C and T spine 04/15: No spinal cord lesions. Hyperintense lesion lower pole right kidney. C4-5 disc protrusion, effaces ventral thecal sac but does not deform the cord    Co-morbidities: positive anti-SSA, osteoporosis (DEXA 10/2018, T score -2.9 at spine, L hip -1.8, L fem neck -2.5), asthma    SH: Spanish speaking. Lives w daughter and son-in-law. Only son in law working. Daughter has a young daughter.    Results:  -02/2018 XR low back-mild grade 1  anterolisthesis of L5 on S1 and probable osteopenia  -11/09/2018 EMG did not show L median/ulnar neuropathy or C8 radic      Interval history:   -last seen 04/09/2019.  At that time experiencing weakness/blurry vision/vertigo, ordered MRI brain/C/T spine, prn Medrol dosepak, encouraged to see PCP for osteoporosis treatment.    In interim-  -took medrol dosepak for vertigo but vertigo persisted  -sent Rx for compazine, ordered COVID19 testing which was positive  -04/30/2019 MRIs stable    -no nausea  -in morning has vertigo.  Compazine helps.     -has fatigue    -sleeping really well at night.  Naps during the day too (unintentional).    -compazine taking bid.   -vertigo is not good.   -doing HEP for vertigo.   -feels weak.     -no headache    -family were covid negative.      -----------------  Background rehab history:  -Mobility: L arm/leg weakness. does lose balance esp crouching (at times falls forward/backwards). Vertigo, eye movements exacerbate, some migrainous component.  Compazine 5 mg bid prn vertigo post COVID19  -ADLS: L hand numbness, easier to drop things from L hand. ADLs/IADLs take longer. Sits for showering.  -Equipment: no AD.  -Speech/swallow/Respiratory:   -Therapy:   -Exercise:    -heat sensitive  -Fatigue: yes.    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    -left side decreased temperature sense. L hand 3rd-5th fingers altered sensation  -Pain: chronic migraine. Ice helps. Celebrex 200 mg/ibuprofen prn. L-sided neuropathic pain. Joint pains. Gabapentin 100 mg qhs. Duloxetine 60 mg daily.  Past: nortriptyline (made her feel bad, felt short of breath)  -Spasticity: muscle pains likely neuropathic    -Skin:    -Bowel:   -Bladder: Urinary urgency. urinary incontinence. Nocturia. Uses pads.  Saw urogyn Dr. Elon SpannerFialkow  Past: tolterodine 2 mg bid (was too expensive, didn't take), oxybutynin  (didn't work), solifenacin (too expensive)  03/14/17 voided 200 mL, PVR 0 mL.  -gyn: removal of R ovary and half of L ovary for R ovarian cyst. 1987 hysterectomy.    -Vision: 03/2018 saw Dr. Rogelia BogaNicholas Chan (resident, ophthal)-full OCTs, Left pterygium causing astigmatism, 20/20, dry eyes. Follow up 6 months.      Review of Systems  5725ft Walk Exam  Time (sec): 7.71  Mobility: No Mobility Aids    Symptoms  Fatigue: Quite a bit  Sleep: Not at all  Double vision: Not at all  Blurry vision: A little bit  Swallowing problems: Not at all  Dizziness / light headedness: A little bit  Numbness or tingling or odd sensations: Quite a bit  Pain: Not at all  Weakness: A little bit  Falling: Not at all  Spasms or jerking: Not at all  Tightness or stiffness: Very much  Bladder problems: A little bit  Bowel problems: Not at all  Sexual problems: Not at all  Depression: Not at all  Anxiety: Not at all  Problems thinking: A little bit  Heat sensitivity: Very much  Skin problems (e.g., injection site reaction): Not at all  Heart palpitations: Very much  Shortness of breath: Very much    Exercise Vitals: Total Minutes of Exercise per Week: (!) 80           Current Outpatient Medications:   .  albuterol HFA 108 (90 Base) MCG/ACT inhaler, Inhale 2 puffs by mouth every 4 hours as needed for shortness of breath/wheezing., Disp: 1 Inhaler, Rfl: 2  .  BIOTIN OR, Take by mouth., Disp: , Rfl:   .  celecoxib 200 MG capsule, Take 1 capsule (200 mg) by mouth daily as needed for pain. Take with food., Disp: 30 capsule, Rfl: 6  .  Cholecalciferol (VITAMIN D) 2000 units Oral Cap, Take 1 capsule (2,000 Units) by mouth daily., Disp: 90 capsule, Rfl: 3  .  DULoxetine 60 MG DR capsule, Take 1 capsule (60 mg) by mouth daily., Disp: 30 capsule, Rfl: 5  .  Ergocalciferol (VITAMIN D OR), Take by mouth., Disp: , Rfl:   .  gabapentin 100 MG capsule, Take 1 capsule (100 mg) by mouth at bedtime., Disp: 30 capsule, Rfl: 11  .  Gabapentin 100 MG Oral Cap, Take 1-3  capsules at night and 1 capsule in the morning., Disp: 360 capsule, Rfl: 3  .  Ibuprofen (ADVIL OR), , Disp: , Rfl:   .  MAGNESIUM OR, Take by mouth., Disp: , Rfl:   .  methylPREDNISolone 4 MG tablet dose pak, Take by mouth as directed on package labeling for duration of 6 days., Disp: 21 tablet, Rfl: 0  .  predniSONE 10 MG tablet, Take 1 tablet (10 mg) by mouth as needed (asthma) for up to 30 doses., Disp: 30 tablet, Rfl: 0  .  prochlorperazine 5 MG tablet, Take 5-10 mg up to three times a day  for vertigo, Disp: 60 tablet, Rfl: 1    Review of patient's allergies indicates:  Allergies   Allergen Reactions   . Morphine    . Penicillins    . Tramadol        Past Medical History:   Diagnosis Date   . Asthma    . Headache    . MS (multiple sclerosis) ([place])    . Osteoporosis        Past Surgical History:   Procedure Laterality Date   . ANES; ANESTH, CS HYSTERECTOMY  1987   . CHOLECYSTECTOMY  2013       Social History     Socioeconomic History   . Marital status: Divorced     Spouse name: Not on file   . Number of children: Not on file   . Years of education: Not on file   . Highest education level: Not on file   Occupational History   . Not on file   Social Needs   . Financial resource strain: Not on file   . Food insecurity     Worry: Not on file     Inability: Not on file   . Transportation needs     Medical: Not on file     Non-medical: Not on file   Tobacco Use   . Smoking status: Never Smoker   . Smokeless tobacco: Never Used   Substance and Sexual Activity   . Alcohol use: No   . Drug use: No   . Sexual activity: Never   Lifestyle   . Physical activity     Days per week: Not on file     Minutes per session: Not on file   . Stress: Not on file   Relationships   . Social Product manager on phone: Not on file     Gets together: Not on file     Attends religious service: Not on file     Active member of club or organization: Not on file     Attends meetings of clubs or organizations: Not on file     Relationship  status: Not on file   . Intimate partner violence     Fear of current or ex partner: Not on file     Emotionally abused: Not on file     Physically abused: Not on file     Forced sexual activity: Not on file   Other Topics Concern   . Not on file   Social History Narrative    Spanish speaking. Used to work in a [employer], currently unable to work due to health problems. Moved from [place] to [place] with her daughter.        Family History     Problem (# of Occurrences) Relation (Name,Age of Onset)    Cancer (1) Sister: cervical cancer    No Known Problems (2) Mother, Father       Negative family history of: Multiple Sclerosis          Physical exam  Vitals:    05/14/19 0755   BP: 123/56   BP Cuff Size: Regular   BP Site: Left Arm   BP Position: Sitting   Pulse: 64   Temp: 96.5 F (35.8 C)   TempSrc: Temporal   Weight: 125 lb (56.7 kg)   Height: 5\' 2"  (1.575 m)     Constit: NAD, seated in office chair.  Eyes: EOM associated with dizziness.  ENMT: speech  clear  Resp: regular respirations, no acc muscle use  Mental status: alert, answers questions appropriately.  Psych: very pleasant.  Neuro:  -gait: was able to walk without assistive device. However, unable to tandem walk.        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    Multiple sclerosis  -we discussed that her MRIs were stable  -she was concerned about brain volume loss. We discussed this can be part of the progressive nature of multiple sclerosis.   She can take alpha lipoic acid 300-600 mg twice a day which may help preserve brain volume based on small [facility] study.  -otherwise we discussed her fatigue, debility, vertigo are likely due to COVID19 and are likely to gradually improve  -with regard to fatigue, we discussed that the compazine for vertigo can cause fatigue and she can consider minimizing use.    Vertigo  -she was referred for physical therapy to work on vertigo as well as  general strengthening.     Follow up 3 months    Histories, medications and problem list have been personally reviewed and updated as appropriate: YES.    25 min face-to-face time spent, >50% in counseling and care coordination. Please see above assessment and plan for topics addressed. Plan above was discussed in detail with patient.

## 2019-05-31 ENCOUNTER — Other Ambulatory Visit: Payer: Self-pay

## 2019-05-31 ENCOUNTER — Emergency Department (HOSPITAL_BASED_OUTPATIENT_CLINIC_OR_DEPARTMENT_OTHER)
Admission: EM | Admit: 2019-05-31 | Discharge: 2019-06-01 | Disposition: A | Discharge status: Home / Self Care | Payer: Self-pay | Attending: Student in an Organized Health Care Education/Training Program | Admitting: Student in an Organized Health Care Education/Training Program

## 2019-05-31 ENCOUNTER — Other Ambulatory Visit: Payer: Self-pay | Admitting: Student in an Organized Health Care Education/Training Program

## 2019-05-31 DIAGNOSIS — R918 Other nonspecific abnormal finding of lung field: Secondary | ICD-10-CM | POA: Insufficient documentation

## 2019-05-31 DIAGNOSIS — R0789 Other chest pain: Secondary | ICD-10-CM

## 2019-05-31 DIAGNOSIS — G35 Multiple sclerosis: Secondary | ICD-10-CM | POA: Insufficient documentation

## 2019-05-31 DIAGNOSIS — R079 Chest pain, unspecified: Secondary | ICD-10-CM | POA: Insufficient documentation

## 2019-05-31 DIAGNOSIS — R911 Solitary pulmonary nodule: Secondary | ICD-10-CM | POA: Insufficient documentation

## 2019-05-31 LAB — CBC, DIFF
% Basophils: 0 %
% Eosinophils: 2 %
% Immature Granulocytes: 0 %
% Lymphocytes: 46 %
% Monocytes: 8 %
% Neutrophils: 44 %
% Nucleated RBC: 0 %
Absolute Eosinophil Count: 0.1 10*3/uL (ref 0.00–0.50)
Absolute Lymphocyte Count: 2.59 10*3/uL (ref 1.00–4.80)
Basophils: 0.02 10*3/uL (ref 0.00–0.20)
Hematocrit: 41 % (ref 36–45)
Hemoglobin: 13.7 g/dL (ref 11.5–15.5)
Immature Granulocytes: 0.01 10*3/uL (ref 0.00–0.05)
MCH: 30.1 pg (ref 27.3–33.6)
MCHC: 33.1 g/dL (ref 32.2–36.5)
MCV: 91 fL (ref 81–98)
Monocytes: 0.48 10*3/uL (ref 0.00–0.80)
Neutrophils: 2.53 10*3/uL (ref 1.80–7.00)
Nucleated RBC: 0 10*3/uL
Platelet Count: 270 10*3/uL (ref 150–400)
RBC: 4.55 10*6/uL (ref 3.80–5.00)
RDW-CV: 12.8 % (ref 11.6–14.4)
WBC: 5.73 10*3/uL (ref 4.30–10.00)

## 2019-05-31 LAB — COMPREHENSIVE METABOLIC PANEL
ALT (GPT): 20 U/L (ref 7–33)
AST (GOT): 19 U/L (ref 9–38)
Albumin: 4.5 g/dL (ref 3.5–5.2)
Alkaline Phosphatase (Total): 78 U/L (ref 31–132)
Anion Gap: 7 (ref 4–12)
Bilirubin (Total): 0.3 mg/dL (ref 0.2–1.3)
Calcium: 9.6 mg/dL (ref 8.9–10.2)
Carbon Dioxide, Total: 29 meq/L (ref 22–32)
Chloride: 102 meq/L (ref 98–108)
Creatinine: 0.58 mg/dL (ref 0.38–1.02)
Glucose: 92 mg/dL (ref 62–125)
Potassium: 3.6 meq/L (ref 3.6–5.2)
Protein (Total): 7.7 g/dL (ref 6.0–8.2)
Sodium: 138 meq/L (ref 135–145)
Urea Nitrogen: 15 mg/dL (ref 8–21)
eGFR by CKD-EPI: >60 mL/min/{1.73_m2} (ref >59)

## 2019-05-31 LAB — TROPONIN_I
Troponin_I Interpretation: NORMAL
Troponin_I: <0.03 ng/mL (ref <0.04)

## 2019-05-31 LAB — PROTHROMBIN & PTT
Partial Thromboplastin Time: 28 s (ref 22–35)
Prothrombin INR: 0.9 (ref 0.8–1.3)
Prothrombin Time Patient: 12.3 s (ref 10.7–15.6)

## 2019-05-31 LAB — D-DIMER,QUANT: D_Dimer, Quant: 0.67 ug{FEU}/mL — ABNORMAL HIGH (ref 0.00–0.59)

## 2019-06-01 LAB — EKG 12 LEAD
Atrial Rate: 72 {beats}/min
Diagnosis: NORMAL
P Axis: 70 degrees
P-R Interval: 164 ms
Q-T Interval: 398 ms
QRS Duration: 90 ms
QTC Calculation: 435 ms
R Axis: 79 degrees
T Axis: 53 degrees
Ventricular Rate: 72 {beats}/min

## 2019-06-11 ENCOUNTER — Encounter (HOSPITAL_BASED_OUTPATIENT_CLINIC_OR_DEPARTMENT_OTHER): Payer: Self-pay

## 2019-06-18 ENCOUNTER — Ambulatory Visit (HOSPITAL_BASED_OUTPATIENT_CLINIC_OR_DEPARTMENT_OTHER)
Payer: Self-pay | Attending: Physical Medicine & Rehabilitation | Admitting: Rehabilitative and Restorative Service Providers"

## 2019-06-18 DIAGNOSIS — R42 Dizziness and giddiness: Secondary | ICD-10-CM

## 2019-06-18 DIAGNOSIS — U071 COVID-19: Secondary | ICD-10-CM

## 2019-06-18 DIAGNOSIS — Z7409 Other reduced mobility: Secondary | ICD-10-CM

## 2019-06-18 DIAGNOSIS — G35 Multiple sclerosis: Secondary | ICD-10-CM

## 2019-06-18 NOTE — Progress Notes (Signed)
[facility] PHYSICAL THERAPY INITIAL PLAN OF CARE          L & I Claim #: N/A    Certification From*: 06/18/19  Certification To*: 07/18/19  ___ Discontinue Therapy Services    Date of Symptom Onset*: 04/15/19  Start of Care Date*: 06/18/19  VISITS FROM SOC: 1    Referring Provider: Dessa PhiGloria Hou, MD  Interpreter Status: Yes - In Person       History of Present Illness: Pt presented to PCP with complaints of weakness, headache and blurry vision 04/09/2019 that progressively got worse. Pt was advised to take a COVID-19 test and results showed she was positive on 04/15/2019. Pt recovered at home and was not hospitalized and took another test two weeks later and was negative. [facility] due to chest pain that radiated to her back. Chest XR, blood test and CT scan was performed and was found to be negative.    CT IMPRESSION:  1. No CT evidence of pulmonary embolus or aortic dissection.     2. Nonspecific bilateral groundglass opacities may represent mild pulmonary   edema or an ongoing infectious or inflammatory process. Viral etiologies   cannot be excluded.      3. Incidental 3 mm solid left upper lobe nodule.       Chest XR FINDINGS AND IMPRESSION:   Cardiac and mediastinal contours are normal.       The lungs and pleural spaces are clear.  There is no pneumothorax.    Pertinent Medical/Surgical History:   Past Medical History:   Diagnosis Date   . Asthma    . Headache    . MS (multiple sclerosis) ([facility])    . Osteoporosis      Social History: Pt lives with daughter and granddaughter in an apartment with 1 flight of stairs (16 steps). Pt is not working currently. Pt reports doing self home exercise program     Current / Past Rehabilitation: [facility] 2018  Prior Level of Function  Previous Mobility: Drives car;Community dweller  ADLs: Independent with all ADLs     Precautions:  Fall risk       Fall Screening:  Are you afraid of falling?: Yes  Have you fallen in the past year?: No  Issues with  walking/balance/feeling unsteady: Yes    SUBJECTIVE:  Patient's Statement: Pt reports that she has been experiencing chest pain when she is under a lot of stress. At the worst she rates her chest pain 8/10. Currently she has no pain. She reports having respiratory problems after Covid. Chest XR showed inflammation. Repeat Covid test was negative.     Started having vertigo ~ 2 years ago. Pt reports vertigo returned with diagnosis of Covid. Pt reports a couple of "flashes" of vertigo a day that last a couple of seconds that throws her off balance. Relaxation and deep breathing help her symptoms. Vertigo comes on randomly, not related to any movement.     Pt reports progressive worsening of vision. No changes in hearing.  Pain Score: 0 - No pain  Patient Stated Goals: Pt wants to get rid of her vertigo and review exercise program     OBJECTIVE:    Range of Motion:   Cervical; flexion: WNL, extension: WNL, L rotation: WNL, R rotation: WNL, L lateral flexion: WNL, R lateral flexion: WNL    R UE: shoulder flexion: WNL, shoulder IR: WNL, shoulder ER: WNL   L UE: shoulder flexion: WNL, shoulder  IR: WNL, shoulder ER: WNL       Strength:  RUE: shoulder flexion = 4/5, elbow flex/ext = 4/5, wrist ext = 5/5, grip = good  LUE:  shoulder flexion = 4/5, elbow flex/ext = 5/5, wrist ext = 5/5, grip = good    RLE: hip flex = 4+/5, knee flex/ext =5/5, ankle DF = 5/5  LLE: hip flex = 5/5, knee flex/ext =5/5, ankle DF = 5/5    Sensation: reports tingling throughout body  Reports diminished sensation on L side ; not formally tested       Coordination:  LE RAMs: impaired, pt coordination declined when asked to increase speed of alternating foot taps   UE RAMs WNLs       Sitting Balance: Pt was able to sit in a seated position without assistance throughout session     Standing Balance:  MCTSIB: Rhomberg = 30 sec (mild sway), Rhomberg EC = 30 sec (mild sway), Foam EO = 30 sec (mod sway) Foam EC: 20 secs (loss balance)      Vestibular:    Smooth Pursuit: WNLs  Vergence: WNLs  Saccades: WNLs  VOR slow: WNLs  Headthrust: L side  Multiple beats of nystagmus/dizziness, too irritable to continue  Hallpike: Negative B  Horizontal Roll test: Negative to L side, upbeating nystagmus to R side    Ambulation:     Gait deviations: B trendelenburg, mild deviations off path   Level of assistance: I  Assistive device: None   10-meter speed:  SSV = 13.30  Fast = 10.12 10MWT: Self selected: 13.30 seconds.  Fast: 10.12 seconds.   Self Selected Gait Speed = 10 m/ 13.30 seconds = 0.75 m/s.   Fast Gait Speed = 10 m/ 10.12 seconds = 0.99 m/s.     Interpretation:  . Less than 0.4 m/sec:   Engineer, technical salesHousehold ambulator  . 0.4 to 0.8 m/sec:  Limited community ambulator  . 0.8 to 1.2 m/sec:  AES CorporationCommunity ambulator   . 1.2 m/sec and above:  Able to safely cross streets    Gain of 0.1 m/s is predictor for well-being in those without normal WS. (Purser 2005; Gwenlyn PerkingHardy, Perera 2007)    Reliability and Validity of 10MWT based on diagnoses  . Geriatric:                          MDC = 0.10 m/s  . Parkinson's Disease        MDC = 0.18 m/s (comfortable GS); 0.25 m/s (Fast GS)  . Spinal Injuries                  MDC = 0.13 m/s   . Stroke                              MDC = 0.10 m/s                                        . TBI                                   MDC = 0.15 m/s (comfortable GS); 0.25 m/s (Fast GS)       Equipment:  None      INTERVENTION:  Evaluation Code 27035 x 60 minutes        Education:    Primary learner: patient  Preferred learning style: visual and hands on   Barriers to learning: language   Cultural practices that influence care: none   Topic taught: post COVID-19 recovery and participation agreement    Response to education: Pt expressed understanding through verbal communication   Reviewed [facility] Participation agreement with pt and explained policy for no-show, late cancellation, service animals, arrival to therapy under the influence of illicit drugs or alcohol, and terms for  discontinuing therapy. Pt agreed and signed the form (see scanned materials).        ASSESSMENT: Pt is a 58 yo female with hx of RRMS and COVID-19 and a current dx of vertigo resulting in decreased ability to perform ADLs when experiencing dizziness and spinning during onset. Pt presents with impairments of decreased balance, LE coordination, reduced sensation of L side of body and nystagmus with L head thrust and R horizontal roll test (upbeating). Due to these limitations, she is unable to perform ADLs during onset of dizziness, expressed fear of falling, feeling unsteady and cannot play with granddaughter for long period of time. Pt requires skilled PT intervention of vestibular rehab to meet goals below. Pt. has a good potential to make progress toward goals of therapy. She is in good health, highly motivated to work with PT and have support from her daugher. However, pt hx of RRMS osteoporosis, headache, asthma, and long term effects of COVID-19  may interfere with her progression.       Clinical Presentation for Selection of Evaluation Code: Evolving  Clinical Decision Making Complexity:High       Fall risk based on assessment: moderate     Rehabilitation potential is: Good    Potential Barriers to achieve rehab goals: History of RRMS, osteoporosis and long term effects of COVID-19    Care Connections Functional Index Score: Outcome Score: 59      GOALS:    Discharge Goals:  1. Pt will demonstrate improvement of dynamic gait as seen by improvement of FGA score by 5 points   2. Pt will demonstrate increased confidence in balance as seen by improved score of ABC by 10%  3. Pt will be able to participate in activities with family as demonstrated in reduction of vertigo frequency       Current Level of Function Monthly Goals     06/18/2019 To be reviewed by:  07/19/2019   Impairments on MCTSIB and decreased balance confidence    Pt able to stand on foam with EC for = or greater than 30 secs   Increased independence  with standing on foam EC    Pt currently performing HEP from last course of PT  Review and progress HEP as indicated   FGA not assessed  Assess FGA   Balanced confidence scale not assessed Assess balance confidence            The above goals and plan of care have been discussed and agreed upon by the patient.    PLAN:   Patient will be seen for 1x/week for approximately 4-6 total visits.      Planned interventions: Therapeutic Activities, Therapeutic Exercise, Neuromuscular Re-education, Gait Training, Canalith Repositioning Maneuver    Plan for next visit:  Reassess for BPPV, assess FGA, Hip strengthening, LE ROM, initiate balance training    Anticipated discharge date: 2020    Wayne Both, [place] [facility] [place]  I have reviewed the documentation of this visit and I confirm that I was present for the entire session and not engaged in other tasks.  I directed / guided the services and am responsible for the assessment and treatment of this patient in this visit.\    Enzo Bi, PT, NCS  Physical Therapist   [facility])  Department of Rehabilitation Medicine

## 2019-06-25 ENCOUNTER — Ambulatory Visit (INDEPENDENT_AMBULATORY_CARE_PROVIDER_SITE_OTHER): Payer: Self-pay | Admitting: Family

## 2019-06-25 ENCOUNTER — Encounter (INDEPENDENT_AMBULATORY_CARE_PROVIDER_SITE_OTHER): Payer: Self-pay | Admitting: Family

## 2019-06-25 VITALS — BP 126/65 | HR 71 | Temp 97.2°F | Resp 20 | Ht 62.0 in | Wt 124.0 lb

## 2019-06-25 DIAGNOSIS — Z1239 Encounter for other screening for malignant neoplasm of breast: Secondary | ICD-10-CM

## 2019-06-25 DIAGNOSIS — R0602 Shortness of breath: Secondary | ICD-10-CM

## 2019-06-25 DIAGNOSIS — R911 Solitary pulmonary nodule: Secondary | ICD-10-CM

## 2019-06-25 DIAGNOSIS — R079 Chest pain, unspecified: Secondary | ICD-10-CM

## 2019-06-25 DIAGNOSIS — R1013 Epigastric pain: Secondary | ICD-10-CM

## 2019-06-25 NOTE — Progress Notes (Signed)
Subjective:  Patient is a 58 year old female, here to discuss ER FOLLOW UP (would like to discuss CT )    The following portions of the patient's history were reviewed with the patient and updated as appropriate: problem list, current medications and allergies.    HPI     Pt is here for ER follow up for chest pain and shortness of breath. EKG, chest XR, and CT scan are normal for any cardiac abnormality.    Pt was seen for chest pain, pressure that gets stronger that leads to shortness of breath. Denies certain food triggers. Pt tries taking tums and didn't help.   Pt also would like to discuss CT scan finding. Reports shortness of breath and chest pain starts after she was diagnosed with Covid in June.   She was told that it will get better overtime.  She denies chest pain/abdominal pain/SOB at this time.SHe denies feeling anxious as well. Denies reflux.    Review of Systems   Constitutional: Negative.    Respiratory: Negative for chest tightness and shortness of breath.    Cardiovascular: Negative for chest pain.   Gastrointestinal: Negative for abdominal pain.   Neurological: Negative for dizziness.   Psychiatric/Behavioral: The patient is not nervous/anxious.        Patient Active Problem List    Diagnosis Date Noted   . Other osteoporosis without current pathological fracture [M81.8] 10/30/2018   . Chronic fatigue [R53.82] 03/14/2017   . Urgency incontinence [N39.41] 03/14/2017   . Impaired functional mobility, balance, and endurance [Z74.09] 10/03/2016   . Multiple sclerosis ([place]) [G35] 09/18/2016   . Chronic migraine without aura without status migrainosus, not intractable [G43.709] 09/18/2016   . Vertigo [R42] 09/18/2016       Current Outpatient Medications:   .  albuterol HFA 108 (90 Base) MCG/ACT inhaler, Inhale 2 puffs by mouth every 4 hours as needed for shortness of breath/wheezing., Disp: 1 Inhaler, Rfl: 2  .  BIOTIN OR, Take by mouth., Disp: , Rfl:   .  celecoxib 200 MG capsule, Take 1 capsule (200  mg) by mouth daily as needed for pain. Take with food., Disp: 30 capsule, Rfl: 6  .  Cholecalciferol (VITAMIN D) 2000 units Oral Cap, Take 1 capsule (2,000 Units) by mouth daily., Disp: 90 capsule, Rfl: 3  .  DULoxetine 60 MG DR capsule, Take 1 capsule (60 mg) by mouth daily., Disp: 30 capsule, Rfl: 5  .  Ergocalciferol (VITAMIN D OR), Take by mouth., Disp: , Rfl:   .  gabapentin 100 MG capsule, Take 1 capsule (100 mg) by mouth at bedtime., Disp: 30 capsule, Rfl: 11  .  Gabapentin 100 MG Oral Cap, Take 1-3 capsules at night and 1 capsule in the morning. (Patient not taking: Reported on 06/25/2019), Disp: 360 capsule, Rfl: 3  .  Ibuprofen (ADVIL OR), , Disp: , Rfl:   .  MAGNESIUM OR, Take by mouth., Disp: , Rfl:   .  predniSONE 10 MG tablet, Take 1 tablet (10 mg) by mouth as needed (asthma) for up to 30 doses., Disp: 30 tablet, Rfl: 0  .  prochlorperazine 5 MG tablet, Take 5-10 mg up to three times a day for vertigo, Disp: 60 tablet, Rfl: 1       Objective:  BP 126/65   Pulse 71   Temp 97.2 F (36.2 C) (Temporal)   Resp 20   Ht 5\' 2"  (1.575 m)   Wt 124 lb (56.2 kg)   SpO2  98%   BMI 22.68 kg/m     Physical Exam  Vitals signs and nursing note reviewed.   Constitutional:       Appearance: Normal appearance.   HENT:      Head: Normocephalic and atraumatic.   Neck:      Musculoskeletal: Normal range of motion and neck supple.   Pulmonary:      Effort: Pulmonary effort is normal.   Neurological:      General: No focal deficit present.      Mental Status: She is alert and oriented to person, place, and time.   Psychiatric:         Mood and Affect: Mood normal.         Behavior: Behavior normal.       Assessment and Plan:    Interpreter utilized during the whole visit.    Shortness of breath  Plan: comes and goes, denies SOB at this time. EKG, Chest XR at ER is unremarkable. Other cause to consider would be anxiety , H.Pylori, or GERD.    Chest pain, unspecified type  Plan: comes and goes, same as above    Breast  cancer screening  Plan: referral given to pt.    Epigastric pain  Plan:  Discuss with pt that if it is not H.Pylori, we are not sure what cause the SOB because all other imaging are unremarkable.  Consider anxiety or asthma attack as the cause as well.  - H. pylori Breath Test; Future  - H. pylori Breath Test    Nodule of upper lobe of left lung  Plan: repeat CT scan in a year.  - CT CHEST W/O CONTRAST; Future    Follow up as needed    I spent a total time of 40 minutes face-to-face with the patient, of which more than 50% was spent counseling and coordinating care as outlined in this note including review results, discuss plan, chart review and differentials.      Pt verbalizes understanding with the plan and denies any further concerns.

## 2019-06-25 NOTE — Progress Notes (Signed)
Patient roomed by Kathrin Penner Justin Mend) D CMA  Accompanied by self .  Allergies , Medications and smoking hx verified and Vital taken for today's visit .    Reason for visit: ER Follow Up  - would like to discuss CT      Cervical screening/PAP:NA  Mammo: Due - referral pended   Colon Screen: UTD  Have you seen a specialist since your last visit: NO        HM Due:   Health Maintenance   Topic Date Due   . Breast Cancer Screening  10/06/2010   . Zoster Vaccine (1 of 2) 10/06/2010   . Pneumococcal Vaccine: Pediatrics (0-5 years) and At-Risk Patients (6-64 years) (1 of 1 - PPSV23) 12/29/2019 (Originally 10/06/1967)   . Influenza Vaccine (1) 07/22/2019   . Colorectal Cancer Screening  01/07/2020   . Depression Screening (PHQ-2)  05/13/2020   . DTaP, Tdap, and Td Vaccines (2 - Td) 10/21/2022   . Lipid Disorders Screening  12/29/2023   . Hepatitis C Screening  Completed   . HIV Screening  Completed   . Hepatitis B Vaccine  Aged Out   . Cervical Cancer Screening  Discontinued              Future Appointments   Date Time Provider [place]   07/02/2019  3:30 PM [facility] PT TEAM 1 H C PT [facility]   08/16/2019  8:00 AM von Kerry Kass, MD [facility]   08/16/2019  9:00 AM Lawson Radar, MD [facility] [place]

## 2019-06-25 NOTE — Patient Instructions (Signed)
It was a pleasure to see you in clinic today. Your Medical Assistant was: Sabby                 You can schedule an appointment to see us by calling [phone] or via eCare.     If labs were ordered today the results are expected to be available via eCare 5 days later. Otherwise, result letters are mailed 7-10 days after your tests are completed. If your physician needs to change your care based on your results, you will receive a phone call to notify you. If you haven't heard from him/her and it has been more than 10 days please give us a call.     Thank you for choosing [facility].

## 2019-06-26 LAB — H. PYLORI BREATH TEST: H. Pylori Breath Test Result: NEGATIVE

## 2019-06-29 ENCOUNTER — Encounter (INDEPENDENT_AMBULATORY_CARE_PROVIDER_SITE_OTHER): Payer: Self-pay | Admitting: Family

## 2019-07-02 ENCOUNTER — Ambulatory Visit (HOSPITAL_BASED_OUTPATIENT_CLINIC_OR_DEPARTMENT_OTHER)
Payer: Self-pay | Attending: Physical Medicine & Rehabilitation | Admitting: Rehabilitative and Restorative Service Providers"

## 2019-07-02 DIAGNOSIS — Z7409 Other reduced mobility: Secondary | ICD-10-CM

## 2019-07-02 DIAGNOSIS — G35 Multiple sclerosis: Secondary | ICD-10-CM

## 2019-07-02 DIAGNOSIS — U071 COVID-19: Secondary | ICD-10-CM

## 2019-07-02 DIAGNOSIS — R42 Dizziness and giddiness: Secondary | ICD-10-CM

## 2019-07-02 NOTE — Progress Notes (Signed)
[place] PHYSICAL THERAPY TREATMENT NOTE  L&I Claim Number:  N/A    Certification From*: 40/10/27  Certification To*: 25/36/64    Visits from [facility]: 2  Referring Provider: Lawson Radar, MD    Interpreter Status: Yes - In Person    SUBJECTIVE: Pt states the frequency of her chest pain has decreased but her dizziness has remained the same since last visit     OBJECTIVE/INTERVENTIONS:    Vitals:   HR: 66 bpm  BP: 107/52 mmHg    Intervention 1- Neuromuscular re-education x 50 minutes    Dix-Hallpike with use of Frenzel lenses: Negative B; L no symptoms, R mild symptoms    Horizontal Roll test with use of Frenzel lenses: Negative B; L no symptoms, R mod to severe symptoms   Activities-specific Balance Confidence (ABC) Scale: 51.25/100 %     Functional Gait Assessment: 16/30   Gait Level surface: 2   Change in gait speed: 2, symptomatic    Gait with horizontal head turns: 0, symptomatic increase dizziness    Gait with vertical head turns: 1, mod dizziness    Gait and pivot turn: 3, min loss of balance    Step over obstacle: 3   Gait with narrow base of support: 1, 5 steps; mod loss of balance    Gait with eyes closed: 0   Ambulating backwards: 2   Steps: 2  FGA cutoff score of 22/30 is effective in classifying fall risk in older adults and predicting unexplained falls in community-dwelling older adults.     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 Therefore vestibular dysfunction is  likely more central in nature from [place], with likely exacerbation from Covid 19.  During the FGA, pt presented with unsteadiness and dizziness throughout sections of test, pt was reminded to take her time during the session to prevent severe onset of symptoms.     PLAN:   Continue intervention per Plan of Care.     Plan for next visit: HEP for habituation (VOR, balance etc.)    Wayne Both, [place]      I have reviewed the documentation of this visit and I confirm that I was present for the entire session and not engaged in other tasks.  I directed / guided the services and am responsible for the assessment and treatment of this patient in this visit.    Tarri Glenn, PT, NCS  Physical Therapist   [facility])  Department of Rehabilitation Medicine

## 2019-07-09 ENCOUNTER — Ambulatory Visit (HOSPITAL_BASED_OUTPATIENT_CLINIC_OR_DEPARTMENT_OTHER): Payer: Self-pay | Admitting: Rehabilitative and Restorative Service Providers"

## 2019-07-09 DIAGNOSIS — R42 Dizziness and giddiness: Secondary | ICD-10-CM

## 2019-07-09 DIAGNOSIS — G35 Multiple sclerosis: Secondary | ICD-10-CM

## 2019-07-09 DIAGNOSIS — Z7409 Other reduced mobility: Secondary | ICD-10-CM

## 2019-07-09 NOTE — Progress Notes (Signed)
[facility] PHYSICAL THERAPY TREATMENT NOTE  L&I Claim Number:  N/A    Certification From*: 65/99/35  Certification To*: 70/17/79    Visits from [facility]: 3  Referring Provider: Lawson Radar, MD    Interpreter Status: Yes - In Person    SUBJECTIVE: Pt hasn't had any recent chest pain. Her dizziness has been improving. She has been having ~ 2 episodes/day that last ~  1 minute. She has been exercising regularly. She does yoga, stairs (17 steps x 4 every other day) and "bicycle" exercise for her legs and abs    OBJECTIVE/INTERVENTIONS:    Intervention 1- Neuromuscular re-education x 40 minutes  - Walking with horizontal and vertical head turns  - Edu on habituation, incorporating more head movement into every day activities (sitting, standing, and walking)  - Tandem gait with arms out, cues to engage core  - SLS, cues to minimize hip drop      Education:    Primary learner: patient   Preferred learning style:  demonstration and interpreter needed  Barriers to learning: language   Cultural practices that influence care: none   Topic taught: HEP with handout    Response to education: Expressed understanding through verbal communication and asking questions     HEP:  Yoga + stairs  Walking with head turns  Tandem gait  SLS  Bicycles for abs -add arms in opposition to legs      ASSESSMENT/PATIENT RESPONSE TO THERAPY: Reviewed exercise program pt is currently doing at home and added habituation and balance exercise. Notice improvement with cues to engage core    PLAN:   Continue intervention per Plan of Care.     Plan for next visit: balance exercise with EC, gaze stability    Tarri Glenn, PT, NCS  Physical Therapist   [facility])  Department of Rehabilitation Medicine

## 2019-07-16 ENCOUNTER — Encounter (HOSPITAL_BASED_OUTPATIENT_CLINIC_OR_DEPARTMENT_OTHER): Payer: Self-pay | Admitting: Rehabilitative and Restorative Service Providers"

## 2019-07-21 ENCOUNTER — Telehealth (HOSPITAL_BASED_OUTPATIENT_CLINIC_OR_DEPARTMENT_OTHER): Payer: Self-pay | Admitting: Rehabilitative and Restorative Service Providers"

## 2019-07-21 NOTE — Telephone Encounter (Signed)
RETURN CALL: Voicemail - Detailed Message      SUBJECT:  General Message     MESSAGE: Pt calling to cancel the 07/23/19 appointment  She has already cancelled 9/25, 10/5, 10/19 and11/4.   States she spoke to Financial counseling  On 9/25 and is waiting to see if she qualifies.  She said they told her it would be 10-14 days before she hears anything.  She also is scheduled for 10/15 and 10/22.  Asking what she should do about these appointments.

## 2019-07-23 ENCOUNTER — Encounter (HOSPITAL_BASED_OUTPATIENT_CLINIC_OR_DEPARTMENT_OTHER): Payer: Self-pay | Admitting: Rehabilitative and Restorative Service Providers"

## 2019-07-26 ENCOUNTER — Encounter (HOSPITAL_BASED_OUTPATIENT_CLINIC_OR_DEPARTMENT_OTHER): Payer: Self-pay | Admitting: Rehabilitative and Restorative Service Providers"

## 2019-08-05 ENCOUNTER — Encounter (HOSPITAL_BASED_OUTPATIENT_CLINIC_OR_DEPARTMENT_OTHER): Payer: Self-pay | Admitting: Rehabilitative and Restorative Service Providers"

## 2019-08-09 ENCOUNTER — Other Ambulatory Visit: Payer: Self-pay

## 2019-08-09 ENCOUNTER — Encounter (HOSPITAL_BASED_OUTPATIENT_CLINIC_OR_DEPARTMENT_OTHER): Payer: Self-pay | Admitting: Rehabilitative and Restorative Service Providers"

## 2019-08-12 ENCOUNTER — Encounter (HOSPITAL_BASED_OUTPATIENT_CLINIC_OR_DEPARTMENT_OTHER): Payer: Self-pay | Admitting: Rehabilitative and Restorative Service Providers"

## 2019-08-16 ENCOUNTER — Encounter (HOSPITAL_BASED_OUTPATIENT_CLINIC_OR_DEPARTMENT_OTHER): Payer: Self-pay | Admitting: Physical Medicine & Rehabilitation

## 2019-08-16 ENCOUNTER — Ambulatory Visit (HOSPITAL_BASED_OUTPATIENT_CLINIC_OR_DEPARTMENT_OTHER): Payer: Self-pay | Admitting: Neurology

## 2019-08-16 ENCOUNTER — Ambulatory Visit: Payer: Self-pay | Attending: Physical Medicine & Rehabilitation | Admitting: Physical Medicine & Rehabilitation

## 2019-08-16 VITALS — BP 126/83 | HR 74 | Temp 97.7°F | Ht 62.0 in | Wt 128.5 lb

## 2019-08-16 DIAGNOSIS — Z9181 History of falling: Secondary | ICD-10-CM

## 2019-08-16 DIAGNOSIS — G43709 Chronic migraine without aura, not intractable, without status migrainosus: Secondary | ICD-10-CM

## 2019-08-16 DIAGNOSIS — M818 Other osteoporosis without current pathological fracture: Secondary | ICD-10-CM

## 2019-08-16 DIAGNOSIS — R2689 Other abnormalities of gait and mobility: Secondary | ICD-10-CM

## 2019-08-16 DIAGNOSIS — G35 Multiple sclerosis: Secondary | ICD-10-CM

## 2019-08-16 DIAGNOSIS — R42 Dizziness and giddiness: Secondary | ICD-10-CM

## 2019-08-16 NOTE — Patient Instructions (Addendum)
-  la trabajadora social  se comunicare con usted.     -preguntele a su doctor cerca de la osteoporosis la proxima vez    -ejercicios:  1) hace en la esquina  2) cierra los ojos  3) gire la cabeza  4) ponga un pie en frente del otro  5) poner ambos pies juntos.     -biotin 300 mg/dia  puede interfera con pruebas de tiroide or ataque cardiaco    cita en 6 meses

## 2019-08-16 NOTE — Progress Notes (Signed)
I personally saw and evaluated the patient. I discussed the patient with Dr. Murvin Natal. I agree with the findings and plan as documented in the note above. I spent 25 minutes with the patient; greater than 50% of time was spent in counseling and coordination of care.    In Summary,  [name] is a 58 year old woman multiple sclerosis and chronic headaches, currently on no DMT, presenting for re-evaluation.     1. Disease modifying therapy: Reviewed most recent MRI from 04/2019, which is unchanged from 2017. Clinically stable by neuro exam (history somewhat limited due to language and cultural barriers). Briefly discussed again option of starting safe DMT (glatiramer may be best option; patient also tolerated Betaseron well per her recollection, though I am somewhat concerned about increase in headaches on interferons). However, given her stable imaging off DMT (for 8 years) and lack of recent relapses, mutually decided to remain off DMT.   - No DMT at this time  - Plan repeat MRI brain, C and T spine in July 2021-2022  - Continue Vitamin D  - Not using tobacco  - Patient to contact our clinic with any new or worsening neurological problems lasting more than 24 hours    2. Symptom management:   - Vertigo: Physical Therapy at [facility] was very helpful but this is still a problem. At least some of her vertigo may be vestibular migraines. Did not try Meclizine. Provided rx for grab bars in shower as some of her vertigo attackes hav eled to falls in the shower.   - Bladder problems: Urge incontinence, which may be due to prior childbirth and s/p hysterectomy. Bladder scan normal in 2018. Oxybutynine did not help. Followed by Urology.   - Fatigue: TSH normal in 07/2015. CBC within normal limits. Did not trial Amandine.    3. Migraines: Headaches well controlled currently. Stopped Nortriptyline due to shortness of breath  - Previously discussed risk of rebound/overuse headache  - Continue Duloxetine 60mg  po  Qday      Return to see me in 6-12 months, sooner if needed

## 2019-08-16 NOTE — Patient Instructions (Signed)
-   Please call our clinic with any new or worsening neurological problems lasting more than 24 hours  - Plan repeat MRI in 1-2 years  - Return to see me in 6-12 months, sooner if needed

## 2019-08-16 NOTE — Progress Notes (Signed)
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 headaches. Happened frequently before starting to Betaseron, less while on the drug.         Had weakness and inability to breath on IV steroids (?) before  Imaging:     MRI brain 07/20: no significant change from previous     MRI brain 02/19: No significant interval change from MRI of head November 12, 2016, with an increase in the number of lesions since May 04, 2004. As previously stated, appearance is mildly atypical for demyelinating disease, but in the absence of vascular risk factors this remains the most likely diagnosis.     MRI brain 01/18: As compared to 11/26/2013, multiple grossly unchanged foci of white matter signal abnormality within the brain, accounting for differences in technique. Relatively nonspecific and could be seen in setting of chronic ischemic vascular disease or demyelinating disease. No abnormal enhancement detected. No PML.     MRI brain 02/15: Stable white matter lesions periventricular and pericallosal.       MRI C and T spine 07/20: no new/enhancing lesions, right hemicord lesion at T2-T3 difficult to see     MRI C and T spine 02/19: Stable appearance of cervical and thoracic spine compared to February 15, 2014 November 12, 2016. As before, there is an equivocal small lesions seen in the right hemicord at T2-T3.    MRI C spine 01/18: No cervical cord lesions. Stable focal T2 prolongation involving the thoracic spine at  T2-T3 seen only on axial imaging and not well correlated on sagittal images. No new lesion or abnormal cord enhancement detected.     MRI C and T spine 04/15: No spinal cord lesions. Hyperintense lesion lower pole right kidney. C4-5 disc protrusion, effaces ventral thecal sac but does not deform the cord    Interval History:   - Last visit with Dr. Elenor Legato 03/13/18    - Last saw Dr. Aileen Fass 05/14/19    - Seen with phone interpreter.    - got COVID in late June 2020, did not require hospitalization with no major complications    - she went to the ED in August 2020 for chest pain, SOB for 3 days. Work up in the ED was negative for PE and she was discharged. She will occasionally have breathing problems still.     - She states she has been up and down since her last visit, but she feels like her visits with Dr. Aileen Fass have been helping with her symptoms.     - Continues on Gabapentin 100 mg, 1 cap at night for nerves. She states it really helps.    - Continues with "crises" or attacks. She is having them twice a month and they can last three days.    - Dizziness continues    - she states she fell once in November 2019 when she was in the shower. She states that she was washing her hair and she had an  attack of vertigo and fell backwards in the tub. She does not have a grab bar.      ROS:   21ft Walk Exam  Time (sec): 6.53  Mobility: No Mobility Aids    Symptoms  Fatigue: A little bit  Sleep: A little bit  Double vision: Not at all  Blurry vision: A little bit  Swallowing problems: A little bit  Dizziness / light headedness: A little bit  Numbness or tingling or odd sensations: A little bit  Pain: Not at all  Weakness: Quite a bit  Falling: Not at all  Spasms or jerking: Not at all  Tightness or stiffness: Not at all  Bladder problems: A little bit  Bowel problems: Not at all  Sexual problems: Not at all  Depression: Not at all  Anxiety: Not at all  Problems thinking: A little bit  Heat sensitivity: Not at all  Skin  problems (e.g., injection site reaction): Not at all  Heart palpitations: A little bit  Shortness of breath: A little bit    Exercise Vitals: Total Minutes of Exercise per Week: (!) 90       Physical Exam:   BP 126/83   Pulse 74   Temp 97.7 F (36.5 C) (Temporal)   Ht 5\' 2"  (1.575 m)   Wt 128 lb 8 oz (58.3 kg)   BMI 23.50 kg/m     General: in no acute distress    Eyes: no scleral icterus or pallor    Respiratory: breathing comfortably on room air    Cardiovascular: no lower extremity edema      Neurologic Exam:     Mental Status: alert & oriented, during today's encounter normal attention, concentration, language, calm and appropriate in contact and mood balanced   Cranial Nerves:  appropriate visual fields, EOMI, no nystagmus, no dysconjugate gaze, no INO, face symmetrical, no facial paresis, facial sensation previously decreased left face to light touch, hearing intact to finger rub, speech fluent, no dysarthria, palate elevates symmetrically, sternocleidomastoids strong and tongue midline without fasciculations   Motor:    Tone: normal.     Atrophy/Fasiculations: not observed     Pronator Drift: none.     Strength: 5/5 in all muscle groups of the upper and lower extremities   Reflexes: deferred  Coordination: finger to nose with no dysmetria, fine finger movements and foot tapping intact   Gait and Balance: gait within exam room intact, can walk  toes, tandem with difficulty but able to perform, unable to walk on heels  Sensory: decreased to touch left hemibody including face    Assessment and Plan:  [name] is a 58 year old woman multiple sclerosis and chronic headaches, currently on no DMT, presenting for re-evaluation.     1. Disease modifying therapy:  MRI brain, C-T spine in 04/2019  unchanged to 2019. Previously discussed option of starting safe DMT (glatiramer may be best option; patient also tolerated Betaseron well per her recollection, though I am somewhat concerned about increase in  headaches on interferons). However, given her stable imaging off DMT (for 9 years) and lack of recent relapses (though limited historian), it also seems reasonable to hold off on DMT.   - Continues off DMT at this time per patient preference  - Continue Vitamin D  - Not using tobacco  - Patient to contact our clinic with any new or worsening neurological problems lasting more than 24 hours  - repeat MRI in 1-2 years  2. Symptom management:   - Vertigo: Continues with constant vertigo. [facility] was very [facility] but this is still a problem. Describedsudden brief vertigo followed by headaches, so she may (also) have vertiginous migraines. Despite adequate treatment for headaches, dizziness still persists.   - Can send rx for meclizine prn     - Bladder problems: Urge incontinence, which may be due to prior childbirth and s/p hysterectomy. Bladder scan normal in 2018. Oxybutynin did not help. Followed by Urology.   - Fatigue: TSH normal in 07/2015. CBC within normal limits. Did not trial Amandine.    3. Migraines: Headaches worse off Nortriptyline. Previously discussed risk of rebound/overuse headache and urged her to limit use of Celebrex or Advil etc. Stopped Nortriptyline due to shortness of breath. Duloxetine has helped with her "crises" that likely related to her migraines.   - continue Duloxetine 60 mg daily for headaches, significant improvement with this medication    Recommended installing grab bar in the shower to prevent falling. Rx provided    Return to see Dr. Elenor LegatoVon Geldern in 6-12 months, sooner if needed    Histories, medications and problem list have been reviewed and updated as appropriate.

## 2019-08-16 NOTE — Progress Notes (Signed)
Physical Medicine and Rehabilitation Note    Chief Complaint: multiple sclerosis  Seen w telephone interpreter    Background neuro history (saw Dr. Elenor Legato):   Diagnosis of RRMS: 2004 based on headaches, balance problems, double vision, MRI brain; no spinal cord lesions; LP neg  Current DMT: None  Past DMT: Betaseron 2004-2010 (stopped after prior neurologist moved away and concern that this is not relapsing MS).    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 headaches. Happened frequently before starting to Betaseron, less while on the drug.         Had weakness and inability to breath on IV steroids x 3 days (?) before  Imaging:    MRI Brain/C/T spine stable 04/2019     MRI brain 01/18: As compared to 11/26/2013, multiple grossly unchanged foci of white matter signal abnormality within the brain, accounting for differences in technique. Relatively nonspecific and could be seen in setting of chronic ischemic vascular disease or demyelinating disease. No abnormal enhancement detected. No PML.     MRI brain 02/15: Stable white matter lesions periventricular and pericallosal.    MRI C spine 01/18: No cervical cord lesions. Stable focal T2 prolongation involving the thoracic spine at T2-T3 seen only on axial imaging and not well correlated on sagittal images. No new lesion or abnormal cord enhancement detected.     MRI C and T spine 04/15: No spinal cord lesions. Hyperintense lesion lower pole right kidney. C4-5 disc protrusion, effaces ventral thecal sac but does not deform the cord    Co-morbidities: positive anti-SSA, osteoporosis (DEXA 10/2018, T score -2.9 at spine, L hip -1.8, L fem neck -2.5), asthma    SH: Spanish speaking. Lives w daughter and son-in-law. Only son in law working. Daughter has a young daughter.    Results:  -02/2018 XR low back-mild grade 1  anterolisthesis of L5 on S1 and probable osteopenia  -11/09/2018 EMG did not show L median/ulnar neuropathy or C8 radic      Interval history:   -last seen 05/14/19. At that time suggested alpha lipoic acid.  Had fatigue/weak sensation/vertigo attributed to COVID19. Suggested minimizing compazine due to fatigue.  Referred to PT for vertigo.    In interim-  -05/31/2019 had chest pain-went to ER. No pulm embolism or aortic dissection seen on CT    -went to PT x 1 session, limited due to lack of insurance.     -saw Dr. Elenor Legato today-no need for DMTs. As MRIs stable 04/2019.     -now back to normal baseline since COVID19, including vertigo.   -off compazine    -not taking alpha lipoic acid. Cost an issue.     -no falls.     -takes vitamin D    -walks, does yoga.     -has questions about biotin, in [place] they were studying biotin.  She takes 1000 mg/day at times      ----------------  Background rehab history:  -Mobility: L arm/leg weakness. does lose balance esp crouching (at times falls forward/backwards). Vertigo, eye movements exacerbate, some migrainous component.  -ADLS: L hand numbness, easier to drop things from L hand. ADLs/IADLs take longer. Sits for showering.  -Equipment: no AD.  -Speech/swallow/Respiratory:   -Therapy:   -Exercise:    -heat sensitive  -Fatigue: yes.    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    -left side decreased temperature sense. L hand 3rd-5th fingers altered sensation  -Pain: chronic migraine. Ice helps. Celebrex 200 mg/ibuprofen prn. L-sided neuropathic pain. Joint pains. Gabapentin 100 mg qhs. Duloxetine 60 mg daily.  Past: nortriptyline (made her feel bad, felt short of breath)  -Spasticity: muscle pains likely neuropathic    -Skin:    -Bowel:   -Bladder: Urinary urgency. urinary incontinence. Nocturia. Uses pads.  Saw urogyn Dr. Elon SpannerFialkow  Past: tolterodine 2 mg  bid (was too expensive, didn't take), oxybutynin (didn't work), solifenacin (too expensive)  03/14/17 voided 200 mL, PVR 0 mL.  -gyn: removal of R ovary and half of L ovary for R ovarian cyst. 1987 hysterectomy.    -Vision: 03/2018 saw Dr. Rogelia BogaNicholas Chan (resident, ophthal)-full OCTs, Left pterygium causing astigmatism, 20/20, dry eyes. Follow up 6 months.      Review of Systems  6525ft Walk Exam  Time (sec): 6.53  Mobility: No Mobility Aids    Symptoms  Fatigue: A little bit  Sleep: A little bit  Double vision: Not at all  Blurry vision: A little bit  Swallowing problems: A little bit  Dizziness / light headedness: A little bit  Numbness or tingling or odd sensations: A little bit  Pain: Not at all  Weakness: Quite a bit  Falling: Not at all  Spasms or jerking: Not at all  Tightness or stiffness: Not at all  Bladder problems: A little bit  Bowel problems: Not at all  Sexual problems: Not at all  Depression: Not at all  Anxiety: Not at all  Problems thinking: A little bit  Heat sensitivity: Not at all  Skin problems (e.g., injection site reaction): Not at all  Heart palpitations: A little bit  Shortness of breath: A little bit    Exercise Vitals:           Current Outpatient Medications:   .  albuterol HFA 108 (90 Base) MCG/ACT inhaler, Inhale 2 puffs by mouth every 4 hours as needed for shortness of breath/wheezing., Disp: 1 Inhaler, Rfl: 2  .  BIOTIN OR, Take by mouth., Disp: , Rfl:   .  celecoxib 200 MG capsule, Take 1 capsule (200 mg) by mouth daily as needed for pain. Take with food., Disp: 30 capsule, Rfl: 6  .  Cholecalciferol (VITAMIN D) 2000 units Oral Cap, Take 1 capsule (2,000 Units) by mouth daily., Disp: 90 capsule, Rfl: 3  .  DULoxetine 60 MG DR capsule, Take 1 capsule (60 mg) by mouth daily., Disp: 30 capsule, Rfl: 5  .  Ergocalciferol (VITAMIN D OR), Take by mouth., Disp: , Rfl:   .  gabapentin 100 MG capsule, Take 1 capsule (100 mg) by mouth at bedtime., Disp: 30 capsule, Rfl: 11  .  Ibuprofen (ADVIL  OR), , Disp: , Rfl:   .  MAGNESIUM OR, Take by mouth., Disp: , Rfl:   .  predniSONE 10 MG tablet, Take 1 tablet (10 mg) by mouth as needed (asthma) for up to 30 doses., Disp: 30 tablet, Rfl: 0    Review of patient's allergies indicates:  Allergies   Allergen Reactions   . Morphine    . Penicillins    . Tramadol        Past Medical History:   Diagnosis Date   . Asthma    . Headache    .  MS (multiple sclerosis) ([place])    . Osteoporosis        Past Surgical History:   Procedure Laterality Date   . ANES; ANESTH, CS HYSTERECTOMY  1987   . CHOLECYSTECTOMY  2013       Social History     Socioeconomic History   . Marital status: Divorced     Spouse name: Not on file   . Number of children: Not on file   . Years of education: Not on file   . Highest education level: Not on file   Occupational History   . Not on file   Social Needs   . Financial resource strain: Not on file   . Food insecurity     Worry: Not on file     Inability: Not on file   . Transportation needs     Medical: Not on file     Non-medical: Not on file   Tobacco Use   . Smoking status: Never Smoker   . Smokeless tobacco: Never Used   Substance and Sexual Activity   . Alcohol use: No   . Drug use: No   . Sexual activity: Never   Lifestyle   . Physical activity     Days per week: Not on file     Minutes per session: Not on file   . Stress: Not on file   Relationships   . Social Product manager on phone: Not on file     Gets together: Not on file     Attends religious service: Not on file     Active member of club or organization: Not on file     Attends meetings of clubs or organizations: Not on file     Relationship status: Not on file   . Intimate partner violence     Fear of current or ex partner: Not on file     Emotionally abused: Not on file     Physically abused: Not on file     Forced sexual activity: Not on file   Other Topics Concern   . Not on file   Social History Narrative    Spanish speaking. Used to work in a [employer], currently unable to  work due to health problems. Moved from [place] to [place] with her daughter.        Family History     Problem (# of Occurrences) Relation (Name,Age of Onset)    Cancer (1) Sister: cervical cancer    No Known Problems (2) Mother, Father       Negative family history of: Multiple Sclerosis          Physical exam  Vitals:    08/16/19 0833   BP: 126/83   BP Cuff Size: Regular   BP Site: Left Arm   BP Position: Sitting   Pulse: 74   Temp: 97.7 F (36.5 C)   TempSrc: Temporal   Weight: 128 lb 8 oz (58.3 kg)   Height: 5\' 2"  (1.575 m)     Constit: NAD, seated in office chair.  Eyes: conj clear  ENMT: speech clear.  Resp: regular respirations, no acc muscle use  Abd: non-distended  Mental status: alert, answers questions appropriately.  Psych: very pleasant.  Neuro:  -gait: was able to walk without assistive device but got a little dizzy.  Tandem gait able to do but with large amplitude trunk deviations  -strength:  Right  SAB4+  EF5  EE5  Grip5    Left  SAB4+  EF5  EE5  Grip5    Right  HF5  HAbd5  KE5  DF5  PF5    Left  HF5  HAbd5  KE5  DF5  PF5      Assessment/Plan:  58 yo F with relapsing remitting multiple sclerosis affecting brain and spine, not on DMT.    Multiple sclerosis  -saw neuro Dr. Elenor LegatoVon Geldern today in separate encounter.   Imaging looked stable, thus no recommendation for DMT.  -pt with questions about biotin, notes has been taking 1000 mg/day. We discussed if she wants to take could try 300 mg/day. However, need to be aware may interfere with cardiac and thyroid labtests.    Osteoporosis  -we reviewed recommendations for adequate calcium/vitamin D as well as weight bearing exercise  -encouraged her to discuss the next time she sees her PCP    Balance disorder  -PT was limited due to insurance issues.  Asked social worker to please double check on her financial assistance status. Pt under the impression that she was told she wouldn't receive this anymore  - we reviewed some ideas for home exercises  in the corner for safety: feet together, single leg stand, feet one in front of another, turning her head, closing her eyes.    Follow up 6 months    Histories, medications and problem list have been personally reviewed and updated as appropriate: YES.    40 min face-to-face time spent, >50% in counseling and care coordination. Please see above assessment and plan for topics addressed. Plan above was discussed in detail with patient.

## 2019-08-17 ENCOUNTER — Telehealth (HOSPITAL_BASED_OUTPATIENT_CLINIC_OR_DEPARTMENT_OTHER): Payer: Self-pay | Admitting: Clinical

## 2019-08-17 NOTE — Telephone Encounter (Signed)
08/16/28 [facility] Social Work Note    Current Issue: Social Work alerted by Dr Rozetta Nunnery that patient reports her financial assistance has terminated and she is not sure why, does not have other medical coverage.    Action:  This Probation officer contacted patient with use of Geophysical data processor.  Let patient know most likely issue is she needs to renew aid by sending in another application, as [facility] financial assistance is not a one time application.  I let patient know I would put a new spanish language application in the mail to her, she will need to complete it as before and attach requested financial documentation.  This Probation officer also provided the phone number for financial counseling department and encouraged her to contact them directly to confirm reapplication is the only issue.     Plan:  Social Work remains available for further assistance as requested by patient or provider

## 2019-08-25 ENCOUNTER — Encounter (HOSPITAL_BASED_OUTPATIENT_CLINIC_OR_DEPARTMENT_OTHER): Payer: Self-pay | Admitting: Rehabilitative and Restorative Service Providers"

## 2020-02-11 ENCOUNTER — Encounter (HOSPITAL_BASED_OUTPATIENT_CLINIC_OR_DEPARTMENT_OTHER): Payer: Self-pay | Admitting: Physical Medicine & Rehabilitation

## 2020-02-11 ENCOUNTER — Ambulatory Visit: Payer: Self-pay | Attending: Physical Medicine & Rehabilitation | Admitting: Physical Medicine & Rehabilitation

## 2020-02-11 VITALS — BP 109/68 | HR 76 | Temp 96.7°F | Ht 62.0 in | Wt 126.3 lb

## 2020-02-11 DIAGNOSIS — J4541 Moderate persistent asthma with (acute) exacerbation: Secondary | ICD-10-CM | POA: Insufficient documentation

## 2020-02-11 DIAGNOSIS — M792 Neuralgia and neuritis, unspecified: Secondary | ICD-10-CM | POA: Insufficient documentation

## 2020-02-11 DIAGNOSIS — G35 Multiple sclerosis: Secondary | ICD-10-CM | POA: Insufficient documentation

## 2020-02-11 DIAGNOSIS — R42 Dizziness and giddiness: Secondary | ICD-10-CM | POA: Insufficient documentation

## 2020-02-11 DIAGNOSIS — M1990 Unspecified osteoarthritis, unspecified site: Secondary | ICD-10-CM | POA: Insufficient documentation

## 2020-02-11 MED ORDER — CELECOXIB 200 MG OR CAPS
200.0000 mg | ORAL_CAPSULE | Freq: Every day | ORAL | 11 refills | Status: DC | PRN
Start: 2020-02-11 — End: 2020-08-11

## 2020-02-11 MED ORDER — GABAPENTIN 100 MG OR CAPS
100.0000 mg | ORAL_CAPSULE | Freq: Every evening | ORAL | 11 refills | Status: DC
Start: 2020-02-11 — End: 2020-08-11

## 2020-02-11 MED ORDER — ALBUTEROL SULFATE HFA 108 (90 BASE) MCG/ACT IN AERS
2.0000 | INHALATION_SPRAY | RESPIRATORY_TRACT | 11 refills | Status: DC | PRN
Start: 2020-02-11 — End: 2020-03-28

## 2020-02-11 MED ORDER — MECLIZINE HCL 12.5 MG OR TABS
12.5000 mg | ORAL_TABLET | Freq: Three times a day (TID) | ORAL | 11 refills | Status: DC | PRN
Start: 2020-02-11 — End: 2021-07-06

## 2020-02-11 MED ORDER — DULOXETINE HCL 60 MG OR CPEP
60.0000 mg | DELAYED_RELEASE_CAPSULE | Freq: Every day | ORAL | 11 refills | Status: DC
Start: 2020-02-11 — End: 2020-08-11

## 2020-02-11 NOTE — Progress Notes (Signed)
Physical Medicine and Rehabilitation Note    Chief Complaint: vertigo    Background neuro history (saw Dr. Elenor Legato):   Diagnosis of RRMS: 2004 based on headaches, balance problems, double vision, MRI brain; no spinal cord lesions; LP neg  Current DMT: None  Past DMT: Betaseron 2004-2010 (stopped after prior neurologist moved away and concern that this is not relapsing MS).    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 headaches. Happened frequently before starting to Betaseron, less while on the drug.         Had weakness and inability to breath on IV steroids x 3 days (?) before  Imaging:    MRI Brain/C/T spine stable 04/2019     MRI brain 01/18: As compared to 11/26/2013, multiple grossly unchanged foci of white matter signal abnormality within the brain, accounting for differences in technique. Relatively nonspecific and could be seen in setting of chronic ischemic vascular disease or demyelinating disease. No abnormal enhancement detected. No PML.     MRI brain 02/15: Stable white matter lesions periventricular and pericallosal.    MRI C spine 01/18: No cervical cord lesions. Stable focal T2 prolongation involving the thoracic spine at T2-T3 seen only on axial imaging and not well correlated on sagittal images. No new lesion or abnormal cord enhancement detected.     MRI C and T spine 04/15: No spinal cord lesions. Hyperintense lesion lower pole right kidney. C4-5 disc protrusion, effaces ventral thecal sac but does not deform the cord    Co-morbidities: positive anti-SSA, osteoporosis (DEXA 10/2018, T score -2.9 at spine, L hip -1.8, L fem neck -2.5)--takes calcium and vitamin D but can't afford additional treatment, asthma    SH: Spanish speaking. Lives w daughter and son-in-law. Only son in law working. Daughter has a young daughter.    Results:  -02/2018 XR low  back-mild grade 1 anterolisthesis of L5 on S1 and probable osteopenia  -11/09/2018 EMG did not show L median/ulnar neuropathy or C8 radic      Interval history:   -last seen 08/16/19. Reviewed some home exercises for balance.     In interim-  -vertigo two episodes in interim    -from MS perspective, doing better, balance improving.     -asthma: ran out of inhaler. When it is cold, it is hard to breath. 2 times in a week may use inhaler    -celebrex:  Denies stomach pain  Take once a week    -duloxetine: only takes as needed     -gabapentin 100 mg one pill as needed    -takes calcium and glucosamine for osteoporosis. Also takes vitamin D every day.  Medication for osteoporosis too expensive.     -Needs refill on pill for vertigo.  Thinks this is meclizine??    -scared of COVID19 vaccine    -still on biotin      ------------------  Background rehab history:  -Mobility: L arm/leg weakness. does lose balance esp crouching (at times falls forward/backwards). Vertigo, eye movements exacerbate, some migrainous component.  -ADLS: L hand numbness, easier to drop things from L hand. ADLs/IADLs take longer. Sits for showering.  -Equipment: no AD.  -Speech/swallow/Respiratory:   -Therapy:   -Exercise:    -heat sensitive  -Fatigue: yes.    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    -left side decreased temperature sense. L hand 3rd-5th fingers altered sensation  -Pain: chronic migraine. Ice helps. Celebrex 200 mg/ibuprofen prn. L-sided neuropathic pain. Joint pains. Gabapentin 100 mg qhs prn. Duloxetine 60 mg daily (but may take as needed).  Past: nortriptyline (made her feel bad, felt short of breath)  -Spasticity: muscle pains likely neuropathic    -Skin:    -Bowel:   -Bladder: Urinary urgency. urinary incontinence. Nocturia. Uses pads.  Saw urogyn Dr. Elon Spanner  Past: tolterodine 2 mg bid (was too expensive, didn't  take), oxybutynin (didn't work), solifenacin (too expensive)  03/14/17 voided 200 mL, PVR 0 mL.  -gyn: removal of R ovary and half of L ovary for R ovarian cyst. 1987 hysterectomy.    -Vision: 03/2018 saw Dr. Rogelia Boga (resident, ophthal)-full OCTs, Left pterygium causing astigmatism, 20/20, dry eyes. Follow up 6 months.        Current Outpatient Medications:     albuterol HFA 108 (90 Base) MCG/ACT inhaler, Inhale 2 puffs by mouth every 4 hours as needed for shortness of breath/wheezing., Disp: 18 g, Rfl: 11    celecoxib 200 MG capsule, Take 1 capsule (200 mg) by mouth daily as needed for pain. Take with food., Disp: 30 capsule, Rfl: 11    Cholecalciferol (VITAMIN D) 2000 units Oral Cap, Take 1 capsule (2,000 Units) by mouth daily., Disp: 90 capsule, Rfl: 3    DULoxetine 60 MG DR capsule, Take 1 capsule (60 mg) by mouth daily., Disp: 30 capsule, Rfl: 11    Ergocalciferol (VITAMIN D OR), Take by mouth., Disp: , Rfl:     gabapentin 100 MG capsule, Take 1 capsule (100 mg) by mouth at bedtime. As needed for nerve pain., Disp: 30 capsule, Rfl: 11    Ibuprofen (ADVIL OR), , Disp: , Rfl:     MAGNESIUM OR, Take by mouth., Disp: , Rfl:     meclizine 12.5 MG tablet, Take 1 tablet (12.5 mg) by mouth 3 times a day as needed (vertigo)., Disp: 30 tablet, Rfl: 11    predniSONE 10 MG tablet, Take 1 tablet (10 mg) by mouth as needed (asthma) for up to 30 doses., Disp: 30 tablet, Rfl: 0      Physical exam  Vitals:    02/11/20 0812   BP: 109/68   Pulse: 76   Temp: 35.9 C     Constit: NAD, seated in office chair.  Eyes: conj clear  ENMT: speech clear.  Resp: regular respirations, no acc muscle use  Abd: non-distended  Mental status: alert, answers questions appropriately.  Psych: very pleasant.    Neuro:  -gait: was able to walk fairly well without assistive  device  -strength:  Right  SAB5  EF5  EE5  Grip5    Left  SAB5  EF5  EE5  Grip5    Right  HF5  HAbd5  KE5  DF5  PF5    Left  HF5  HAbd5  KE5  DF5  PF5      Assessment/Plan:  59 yo F with relapsing remitting multiple sclerosis affecting brain and spine, not on DMT.    Multiple sclerosis  -doing well, balance improved  -advised to stop biotin, since research not supporting it at this time  -advised MS Society recommends she receive COVID19 vaccination. She is afraid of it due to allergies and not sure if it is safe. Let her know medication allergies  do not contraindicate vaccine.    Osteoporosis  -on calcium/vitamin D. Reports bisphosphonates too expensive    Vertigo  -she is taking duloxetine prn. Explained more effective to take scheduled to prevent vertigo  -refilled meclizine 12.5 mg as needed    Neuropathic pain  -refilled gabapentin    Arthritis  -refilled celebrex. Explained safest to take with food to protect stomach. She takes once a week.    Asthma  -refilled her inhaler     Follow up 6 months    Histories, medications and problem list have been personally reviewed and updated as appropriate: YES.    TOTAL TIME: I have spent  45 minutes on the patient's care on the date of service, including chart review/pre-charting prior to the visit, face-to-face time with the patient, and documentation and coordination of care after the visit.

## 2020-02-11 NOTE — Patient Instructions (Addendum)
Refilled medications:    -duloxetine  tomar [employer] por la maana para prevenir el vrtigo    -albuterol   segn sea necesario para el asma    -Meclizine  segn sea necesario para el vertigo    -Celebrex para dolor  tomar con alimentos para proteger el estmago    -gabapentin  segn sea necesario para dolor de los nervios    -dejar de tomar biotina  no funciona    -[employer] de Multiple Sclerosis recomienda la vacuna COVID19

## 2020-03-27 ENCOUNTER — Telehealth (HOSPITAL_BASED_OUTPATIENT_CLINIC_OR_DEPARTMENT_OTHER): Payer: Self-pay

## 2020-03-27 NOTE — Telephone Encounter (Signed)
Patient came in to clinic today without a previously scheduled appointment. Dr. Aileen Fass had previously refilled her inhaler and she has been needing to use it more frequently over the last 10 days. She states that she is wondering if she should be using a different medication or what to do. She has not been tested for COVID-19.    Patient does not seem in acute distress and is able to speak in full sentences. I advised patient that Dr. Aileen Fass does not have the expertise to advance her asthma treatment. Also, advised that this may be COVID-19 and she should seek assessment at an urgent care or ER. Given patient is able to walk and speak in full sentences, appears to be in no acute distress, advised to go to [facility] or [place] walk in clinic. Printed out addresses for patient. She states she does not have a primary care provider.    Dr. Aileen Fass - FYI.    Please CLOSE encounter and Do not route encounter.

## 2020-03-28 ENCOUNTER — Encounter (INDEPENDENT_AMBULATORY_CARE_PROVIDER_SITE_OTHER): Payer: Self-pay | Admitting: Family Medicine

## 2020-03-28 ENCOUNTER — Other Ambulatory Visit (HOSPITAL_BASED_OUTPATIENT_CLINIC_OR_DEPARTMENT_OTHER): Payer: Self-pay

## 2020-03-28 ENCOUNTER — Ambulatory Visit (INDEPENDENT_AMBULATORY_CARE_PROVIDER_SITE_OTHER): Payer: Self-pay | Admitting: Family Medicine

## 2020-03-28 VITALS — BP 107/48 | HR 66 | Temp 97.4°F | Resp 19 | Wt 124.0 lb

## 2020-03-28 DIAGNOSIS — J45901 Unspecified asthma with (acute) exacerbation: Secondary | ICD-10-CM

## 2020-03-28 MED ORDER — QVAR REDIHALER 40 MCG/ACT IN AERB
2.0000 | INHALATION_SPRAY | Freq: Two times a day (BID) | RESPIRATORY_TRACT | 3 refills | Status: DC
Start: 2020-03-28 — End: 2020-03-28

## 2020-03-28 MED ORDER — ALBUTEROL SULFATE (2.5 MG/3ML) 0.083% IN NEBU
2.5000 mg | INHALATION_SOLUTION | Freq: Once | RESPIRATORY_TRACT | Status: DC
Start: 2020-03-28 — End: 2020-03-28

## 2020-03-28 MED ORDER — METHYLPREDNISOLONE ACETATE 80 MG/ML IJ SUSP
80.0000 mg | Freq: Once | INTRAMUSCULAR | Status: AC
Start: 2020-03-28 — End: 2020-03-28
  Administered 2020-03-28: 80 mg via INTRAMUSCULAR

## 2020-03-28 MED ORDER — ALBUTEROL SULFATE HFA 108 (90 BASE) MCG/ACT IN AERS
2.0000 | INHALATION_SPRAY | RESPIRATORY_TRACT | 3 refills | Status: DC | PRN
Start: 2020-03-28 — End: 2020-03-28

## 2020-03-28 MED ORDER — AEROCHAMBER MV MISC
0 refills | Status: AC
Start: 2020-03-28 — End: ?

## 2020-03-28 MED ORDER — ALBUTEROL SULFATE HFA 108 (90 BASE) MCG/ACT IN AERS
2.0000 | INHALATION_SPRAY | RESPIRATORY_TRACT | 3 refills | Status: AC | PRN
Start: 2020-03-28 — End: ?
  Filled 2020-03-28: qty 18, 17d supply, fill #0
  Filled 2021-01-12: qty 18, 17d supply, fill #1

## 2020-03-28 MED ORDER — QVAR REDIHALER 40 MCG/ACT IN AERB
2.0000 | INHALATION_SPRAY | Freq: Two times a day (BID) | RESPIRATORY_TRACT | 3 refills | Status: DC
Start: 2020-03-28 — End: 2020-05-26
  Filled 2020-03-28: qty 10.6, 30d supply, fill #0

## 2020-03-28 NOTE — Progress Notes (Addendum)
Debra Torres is a 60 year old female who presents to the [facility] with a Asthma (10 days using inhaler more than usual not effective recently, not able to sleep, wheezing and feels labored, poss reaction to a med rx'd by DDS. )    59 year old Hispanic speaking woman who comes in today because of her asthma.  Using interpreter on the phone it sounds as if the patient has had a long history of intermittent asthma.  There are times when she does need the inhaler and times when she does.  She has been using her albuterol at bedtime as a preventative agent since she was taken away from using what sounds like an Advair Diskus in [place].  She uses her inhaler without a spacer up to every 4 hours when she has a problem.  She has flares almost monthly that last anywhere from 5 to 10 days.  She came in today having not taken her inhaler since last night as she wanted [place] to see how bad she gets.  She has not had any fevers or chills.  She has not been coughing up any phlegm.  She states the albuterol does not work very well for her.  She does not use a spacer with it however.  She had a recent dental procedure to repair bridge and is wondering if this may have set off her asthma.  She was given 30 tablets of prednisone by her PCP last year but does not know if she even use the medicine or filled the prescription.    I reviewed the patient's recorded medical history and confirmed the medications, problem list, allergies, past medical history, past surgical history and social history.      Past Medical History:   Diagnosis Date    Asthma     Headache     MS (multiple sclerosis) ([place])     Osteoporosis        Patient Active Problem List   Diagnosis    Multiple sclerosis (HCC)    Chronic migraine without aura without status migrainosus, not intractable    Vertigo    Impaired functional mobility, balance, and endurance    Chronic fatigue    Urgency incontinence     Other osteoporosis without current pathological fracture       Outpatient Medications Marked as Taking for the 03/28/20 encounter (Office Visit) with Quincy Sheehan, MD   Medication Sig Dispense Refill    albuterol HFA 108 (90 Base) MCG/ACT inhaler Inhale 2 puffs by mouth every 4 hours as needed for shortness of breath/wheezing. 18 g 11    celecoxib 200 MG capsule Take 1 capsule (200 mg) by mouth daily as needed for pain. Take with food. 30 capsule 11    Cholecalciferol (VITAMIN D) 2000 units Oral Cap Take 1 capsule (2,000 Units) by mouth daily. 90 capsule 3    DULoxetine 60 MG DR capsule Take 1 capsule (60 mg) by mouth daily. 30 capsule 11    Ergocalciferol (VITAMIN D OR) Take by mouth.      gabapentin 100 MG capsule Take 1 capsule (100 mg) by mouth at bedtime. As needed for nerve pain. 30 capsule 11    Ibuprofen (ADVIL OR)       MAGNESIUM OR Take by mouth.      meclizine 12.5 MG tablet Take 1 tablet (12.5 mg) by mouth 3 times a day as needed (vertigo). 30 tablet 11    predniSONE  10 MG tablet Take 1 tablet (10 mg) by mouth as needed (asthma) for up to 30 doses. 30 tablet 0       Review of patient's allergies indicates:  Allergies   Allergen Reactions    Morphine     Penicillins     Tramadol        Past Surgical History:   Procedure Laterality Date    PR ANES; ANESTH, CS HYSTERECTOMY  1987    PR CHOLECYSTECTOMY  2013       Social History     Tobacco Use    Smoking status: Never Smoker    Smokeless tobacco: Never Used   Substance Use Topics    Alcohol use: No    Drug use: No       Parts of this medical record are completed using a dragon dictation system.    Review of Systems   Constitutional: Positive for activity change. Negative for appetite change, chills and fever.   HENT: Negative for congestion and rhinorrhea.    Respiratory: Positive for cough, shortness of breath and wheezing.    Gastrointestinal: Negative for diarrhea and nausea.       BP 107/48    Pulse 66    Temp 36.3 C (Temporal)     Resp 19    Wt 56.2 kg (124 lb)    SpO2 99%    BMI 22.68 kg/m   Physical Exam   Constitutional: She is oriented to person, place, and time. She appears well-developed and well-nourished. She appears distressed.   HENT:   Head: Normocephalic and atraumatic.   Eyes: Conjunctivae are normal.   Pulmonary/Chest: She is in respiratory distress. She has wheezes. She has no rales.   Cough noted on inspiration.  Moderate expiratory prolongation with end expiratory wheezing.  Fair vital capacity.  No rales are noted.   Neurological: She is alert and oriented to person, place, and time.   Skin: Skin is warm and dry.   Psychiatric: She has a normal mood and affect. Her behavior is normal. Judgment normal.   Nursing note and vitals reviewed.    (J45.901) Moderate asthma with exacerbation, unspecified whether persistent  (primary encounter diagnosis)  Plan: beclomethasone HFA (Qvar RediHaler) 40 MCG/ACT         inhaler, albuterol (2.5 MG/3ML) 0.083%         nebulizer solution 2.5 mg, albuterol HFA 108         (90 Base) MCG/ACT inhaler, Spacer/Aero-Holding         Chambers (AeroChamber MV) miscellaneous,         methylPREDNISolone acetate (DEPO-Medrol)         injection 80 mg        Unclear what the patient's normal status is as she has been without her inhaler for 12 hours.  With the intermittent nature she is describing I do not think she needs long-acting bronchodilator and Advair but probably could benefit from Qvar to receive the cortisone agent.  Because she appears to be considerably tight today but go ahead and give her some Depo-Medrol 80 to cover her for about the next 5 to 6 days given a chance for the Qvar to start to work.  I am also going to write her for spacer and instruct her on the use of the inhaler with the spacer.    Unable to give nebulizer treatment due to Covid regulations in the clinic.  Did manage to find a box error chambers and was instructed  a one of the MAs how to use the chamber with the inhaler.   She was given 2 puffs of albuterol with a spacer and inhaler.  Following this she had improvement in her breathing.  She was able to talk comfortably without distress.  I continue the conversation via the interpreter with her and made sure she knew how to use her inhalers that were prescribed.    Patient Instructions   Thank you for choosing the Urgent Care at [facility] for your visit today.  Your Provider today was Dr Rulon Eisenmenger  If you receive a survey in the mail please complete this as it helps [place] improve our care to you.    If you have lab tests that have not been completed at the time of your discharge, you will be contacted when they're completed.  This may be done either via eCare or via the phone.  If you had x-rays today you were given a preliminary diagnosis from the provider.  If something of significance difference is seen by the radiologist you will be contacted and those differences will be described or explained.  If you sign up with eCare you will be able to view the x-ray reports and the lab work within a few days of their completion.    If you have questions about your care you can call the [facility] clinic during office hours at [phone], or contact the [place] 24 hour number at [phone].      Please follow-up with your primary care provider in 3-5 days or sooner if you are getting worse. If you are in need of a primary care provider and would like to establish care with [facility], please call [phone].    Your specific instructions are listed below:    Use the Qvar inhaler 2 puffs twice daily every day.  Use the albuterol 2 puffs every 4 hours with a spacer if needed for shortness of breath.  Make a follow-up appointment with your primary care doctor for the next month to see how you are doing.  If you develop high fever or worsening breathing you should return.    Had a discussion with the cost of the patient's medicines.  She has no medical  insurance but does have charity care through [facility].  Therefore redirected her prescriptions to [facility] pharmacy on [address] which should cover them

## 2020-03-28 NOTE — Progress Notes (Signed)
Medication reconciliation, allergies, pharmacy verified and vitals taken by Arturo Morton, MAC, STR    Medication Administrations This Visit       methylPREDNISolone acetate (DEPO-Medrol) injection 80 mg Admin Date  03/28/2020  13:44 Action  Given Dose  80 mg Route  Intramuscular Site  Left Deltoid Administered By  Rhoderick Moody, CMA    Ordering Provider: Quincy Sheehan, MD    NDC: [phone]    Lot#: [id number]

## 2020-03-28 NOTE — Patient Instructions (Signed)
Thank you for choosing the Urgent Care at [facility] for your visit today.  Your Provider today was Dr Rulon Eisenmenger  If you receive a survey in the mail please complete this as it helps [place] improve our care to you.    If you have lab tests that have not been completed at the time of your discharge, you will be contacted when they're completed.  This may be done either via eCare or via the phone.  If you had x-rays today you were given a preliminary diagnosis from the provider.  If something of significance difference is seen by the radiologist you will be contacted and those differences will be described or explained.  If you sign up with eCare you will be able to view the x-ray reports and the lab work within a few days of their completion.    If you have questions about your care you can call the [facility] clinic during office hours at [phone], or contact the [place] 24 hour number at [phone].      Please follow-up with your primary care provider in 3-5 days or sooner if you are getting worse. If you are in need of a primary care provider and would like to establish care with [facility], please call [phone].    Your specific instructions are listed below:    Use the Qvar inhaler 2 puffs twice daily every day.  Use the albuterol 2 puffs every 4 hours with a spacer if needed for shortness of breath.  Make a follow-up appointment with your primary care doctor for the next month to see how you are doing.  If you develop high fever or worsening breathing you should return.

## 2020-03-29 ENCOUNTER — Other Ambulatory Visit (HOSPITAL_BASED_OUTPATIENT_CLINIC_OR_DEPARTMENT_OTHER): Payer: Self-pay

## 2020-04-28 ENCOUNTER — Encounter (INDEPENDENT_AMBULATORY_CARE_PROVIDER_SITE_OTHER): Payer: Self-pay | Admitting: Physician Assistant

## 2020-05-01 ENCOUNTER — Encounter (INDEPENDENT_AMBULATORY_CARE_PROVIDER_SITE_OTHER): Payer: Self-pay | Admitting: Physician Assistant

## 2020-05-05 ENCOUNTER — Encounter (INDEPENDENT_AMBULATORY_CARE_PROVIDER_SITE_OTHER): Payer: Self-pay

## 2020-05-09 ENCOUNTER — Encounter (INDEPENDENT_AMBULATORY_CARE_PROVIDER_SITE_OTHER): Payer: Self-pay | Admitting: Physician Assistant

## 2020-05-26 ENCOUNTER — Encounter (INDEPENDENT_AMBULATORY_CARE_PROVIDER_SITE_OTHER): Payer: Self-pay | Admitting: Physician Assistant

## 2020-05-26 ENCOUNTER — Other Ambulatory Visit (HOSPITAL_BASED_OUTPATIENT_CLINIC_OR_DEPARTMENT_OTHER): Payer: Self-pay

## 2020-05-26 ENCOUNTER — Ambulatory Visit (INDEPENDENT_AMBULATORY_CARE_PROVIDER_SITE_OTHER): Payer: Self-pay | Admitting: Physician Assistant

## 2020-05-26 VITALS — BP 107/60 | HR 65 | Temp 97.7°F | Resp 18 | Ht 62.0 in | Wt 120.0 lb

## 2020-05-26 DIAGNOSIS — J45901 Unspecified asthma with (acute) exacerbation: Secondary | ICD-10-CM

## 2020-05-26 MED ORDER — METHYLPREDNISOLONE ACETATE 80 MG/ML IJ SUSP
80.0000 mg | Freq: Once | INTRAMUSCULAR | Status: AC
Start: 2020-05-26 — End: 2020-05-26
  Administered 2020-05-26: 80 mg via INTRAMUSCULAR

## 2020-05-26 MED ORDER — QVAR REDIHALER 80 MCG/ACT IN AERB
2.0000 | INHALATION_SPRAY | Freq: Two times a day (BID) | RESPIRATORY_TRACT | 1 refills | Status: AC
Start: 2020-05-26 — End: 2021-02-11
  Filled 2020-05-26: qty 10.6, 30d supply, fill #0
  Filled 2021-01-12: qty 10.6, 30d supply, fill #1

## 2020-05-26 MED ORDER — ALBUTEROL SULFATE (2.5 MG/3ML) 0.083% IN NEBU
2.5000 mg | INHALATION_SOLUTION | RESPIRATORY_TRACT | 0 refills | Status: AC | PRN
Start: 2020-05-26 — End: ?
  Filled 2020-05-26: qty 75, 4d supply, fill #0

## 2020-05-26 NOTE — Progress Notes (Signed)
Chief Complaint   Patient presents with   . Breathing Problem     Patient complains of SOB, tightness in chest, and says that her  Albuterol inhaler is not working.       Subjective:   Debra Torres is a 59 year old female who presents on 05/26/2020 for the following concerns:     Asthma: C/o 1 week of worsening asthma symptoms. Seen here 2 months ago with a 10 day asthma exacerbation, was started on QVAR and also given dose IM of DepoMedrol. She reports she did feel better after visit, but slowly worsening symptoms starting about 1 month ago, then particularly worse in past week. States her asthma is normally not this active, not sure what is triggering worsening. Notes that she has been afraid to get COVID-19 vaccine because she's been told it might worsen her asthma. She's been getting increasing pressure from her employer to get the vaccine.      I reviewed the patient's recorded medical history and confirmed the medications, problem list, allergies, past medical history, past surgical history and social history.    Review of Systems   Constitutional: Negative for chills and fever.   HENT: Negative for congestion and rhinorrhea.    Respiratory: Positive for cough, chest tightness and shortness of breath.    Cardiovascular: Negative for leg swelling.          Objective:   Vitals: BP 121/63   Pulse 70   Temp 36.5 C (Oral)   Resp 30   Ht 5\' 2"  (1.575 m)   Wt 54.4 kg (120 lb)   SpO2 97%   BMI 21.95 kg/m   Physical Exam   Constitutional: She appears well-developed and well-nourished. No distress.   Cardiovascular: Normal rate, regular rhythm and normal heart sounds.   No murmur heard.  Pulmonary/Chest: Effort normal. She has wheezes (faint, late exhalation ). She has no rhonchi. She has no rales.   Musculoskeletal:         General: No edema.   Neurological: She is alert.   Skin: Skin is warm and dry.   Psychiatric: Her behavior is normal.          Assessment and Plan:   Moderate asthma with  exacerbation, unspecified whether persistent  Poorly controlled asthma for past 2 months, slightly better since starting QVAR, but suspect she needs higher dose  Will repeat Depo-Medrol, since she felt this was helpful in June, and also had her take 4 puffs of albuterol MDI while in clinic (her own supply)  She reported feeling a bit more comfortable and her RR decreased to normal range  Recommend she double her QVAR dose, and use either nebulizer or 4 puffs albuterol prn for rescue  Schedule asthma management visit with PCP in about 1 month, or return sooner to UC/ER if worsening    We also discussed COVID-19 vaccination. Discussed no established risk related to asthma, while there is definite risk of poor outcome were she to contract COVID-19. Understandable if she doesn't want to get vaccine today while feeling ill-- discussed might be hard to identify vaccine side effect vs asthma related symptoms. She agrees to return in next few weeks for vaccine once symptoms stabilized.   - methylPREDNISolone acetate (DEPO-Medrol) injection 80 mg  - beclomethasone HFA (Qvar RediHaler) 80 MCG/ACT inhaler; Inhale 2 puffs by mouth 2 times a day.  Dispense: 10.6 g; Refill: 1  - albuterol (2.5 MG/3ML) 0.083% nebulizer solution; Inhale 3 mL (2.5 mg)  via nebulizer every 4 hours as needed for shortness of breath/wheezing.  Dispense: 90 mL; Refill: 0    I spent a total of 36 minutes for the patient's care on the date of the service.          Elsie Lincoln, PA-C  [facility]  [id number] [place] [address]  [phone]

## 2020-05-26 NOTE — Progress Notes (Signed)
Medication Administrations This Visit       methylPREDNISolone acetate (DEPO-Medrol) injection 80 mg Admin Date  05/26/2020  14:38 Action  Given Dose  80 mg Route  Intramuscular Site  Left Deltoid Administered By  Sheilah Pigeon, CMA    Ordering Provider: Georga Bora, PA-C    NDC: 5364-6803-21    Lot#: [id number]    Comments: [id number]  03/2021  [id number]      Carliss Quast S/CMA

## 2020-05-26 NOTE — Patient Instructions (Signed)
1) We are repeating the steroid injection that you received in June-- this helps control the asthma for 1-2 weeks, until the other medications have time to start working    2) Increase your QVAR inhaler dose to 160 mcg twice daily (either 4 puffs of the 40 mcg inhaler, or 2 puffs of the 80 mcg inhaler)    3) Continue albuterol as needed when shortness of breath worsens-- you can try up to 4 puffs at a time of the inhaler, or try the nebulizer.     4) Schedule follow up with Tawni Levy to recheck your asthma in about 1 month. You can return any time for the COVID-19 vaccine when you feel like your asthma is improving-- call or walk in for a vaccine appointment.     1) Estamos repitiendo la inyeccin de esteroides que recibi en junio; esto ayuda a Sales executive asma durante 1-2 semanas, hasta que los otros medicamentos tengan tiempo de Games developer a Scientist, water quality.    2) Aumente la dosis de su inhalador QVAR a 160 mcg dos veces al da (ya sea 4 inhalaciones del inhalador de 40 mcg o 2 inhalaciones del inhalador de 80 mcg)    3) Contine con el albuterol segn sea necesario cuando empeore la falta de aire; puede probar hasta 4 inhalaciones a la vez del Armed forces operational officer o Stage manager.    4) Programe un seguimiento con Tawni Levy para volver a controlar su asma en aproximadamente 1 mes. Puede regresar en cualquier momento para recibir la vacuna COVID-19 cuando sienta que su asma est mejorando: llame o acuda para una cita de vacunacin.

## 2020-05-29 ENCOUNTER — Other Ambulatory Visit (HOSPITAL_BASED_OUTPATIENT_CLINIC_OR_DEPARTMENT_OTHER): Payer: Self-pay

## 2020-06-03 ENCOUNTER — Ambulatory Visit (INDEPENDENT_AMBULATORY_CARE_PROVIDER_SITE_OTHER): Payer: Self-pay

## 2020-06-09 ENCOUNTER — Ambulatory Visit (HOSPITAL_BASED_OUTPATIENT_CLINIC_OR_DEPARTMENT_OTHER): Payer: Self-pay | Admitting: Family

## 2020-06-09 ENCOUNTER — Emergency Department: Payer: Self-pay

## 2020-06-09 ENCOUNTER — Encounter (INDEPENDENT_AMBULATORY_CARE_PROVIDER_SITE_OTHER): Payer: Self-pay | Admitting: Family Medicine

## 2020-06-09 VITALS — BP 132/78 | HR 60 | Temp 98.3°F | Resp 16 | Wt 117.0 lb

## 2020-06-09 DIAGNOSIS — R0602 Shortness of breath: Secondary | ICD-10-CM

## 2020-06-09 DIAGNOSIS — R5383 Other fatigue: Secondary | ICD-10-CM

## 2020-06-09 DIAGNOSIS — G35 Multiple sclerosis: Secondary | ICD-10-CM

## 2020-06-09 DIAGNOSIS — T881XXA Other complications following immunization, not elsewhere classified, initial encounter: Secondary | ICD-10-CM

## 2020-06-09 DIAGNOSIS — R001 Bradycardia, unspecified: Secondary | ICD-10-CM

## 2020-06-09 LAB — POC COVID-19 QUAL RAPID PCR, ONSITE (UWNC): COVID-19 Qualitative Rapid PCR Result (UWNC): NEGATIVE

## 2020-06-09 NOTE — Result Encounter Note (Signed)
Pt informed of EKG results; Referred to the ER for evaluation of SOB>

## 2020-06-09 NOTE — Patient Instructions (Addendum)
Plan; Pt hx of MS, Post immunization reaction.     1. Go directly to the ER for evaluation of SOB, fatigue, dizziness. Post immunization reaction, immunization Pfizer 1 week ago.   Patient Education     Falta de aire (dispnea)  La falta de aire es lasensacinde que no puede respirar o de que no puede tomar aire suficiente. Tambin se conoce como "dispnea".  La dispnea puede deberse a muchas afecciones diferentes. Por ejemplo:   Un ataque agudo de asma   Un empeoramiento de enfermedades tales como la EPOC, la bronquitis crnica o el enfisema   Insuficiencia cardaca congestiva. Esto es un msculo cardaco debilitado que permite que se acumule lquido en los pulmones   Ansiedad o ataques de pnico. El miedo puede hacer que respire con rapidez y eso causa hiperventilacin   Neumona o una infeccin en el tejido de los pulmones   Exposicin a sustancias txicas, vapores, humo o ciertos medicamentos   Cogulo de Retail buyer en el pulmn (embolia pulmonar)   Ataque al corazn o angina   Anemia   Colapso de pulmn (neumotrax)   Deshidratacin   Embarazo  Segn su visita de hoy, no se sabe exactamente a qu se debe su falta de aire. Los resultados de sus pruebas no muestran ninguna de las causas graves de dispnea. Puede que necesite otras pruebas para saber si tiene un problema grave. Es importante que est atento para ver si aparecen nuevos sntomas o si sus sntomas empeoran. Programe una visita de control con su mdico segn le indiquen.  Cuidados en la casa  Siga estos consejos para cuidar de usted en su casa:   Cuando sus sntomas mejoren, vuelva a sus actividades habituales.   Si fuma, debe dejar de hacerlo. nase a un programa para dejar de fumar o pida ayuda a su mdico.   Coma de manera saludable y [place].   Haga actividad fsica con regularidad. Pero hable con su mdico antes de iniciar un programa de actividad fsica, especialmente, si tiene otros problemas mdicos.   Reduzca la cantidad de  cafena estimulantes que consume.  Visita de control  Programe una visita de control con su proveedor de atencin mdica.  Si le hicieron un cultivo, le dirn si es necesario cambiarle el [place]. Puede llamar en dos o [place], segn le indiquen, para [employer].  Si le hicieron radiografas, y no las vio un radilogo mientras usted estuvo aqu, las revisarn. Le dirn si hay algn [employer], especialmente, si afecta su tratamiento.  Cundo debe llamar al 911  Llame al 911 si ocurre algo de lo siguiente:   Tiene problemas para respirar   Se siente confundido o es difcil despertarlo   Se desmaya o queda inconsciente   Tiene frecuencia cardaca rpida   Tiene un dolor nuevo en el pecho, el brazo, el hombro, el cuello o la parte superior de la espalda  Cundo debe buscar atencin mdica?  Llame a su proveedor de atencin mdica de inmediato si tiene cualquiera de los siguientes sntomas:   Falta de aire o silbidos que Radio broadcast assistant, dolor o inflamacin en una pierna   Inflamacin en ambas piernas o ambos tobillos   Suba de peso inesperada   Dolor en el pecho, el brazo, el hombro, el cuello o la parte superior de la espalda   Mareos o debilidad   La sensacin de que su corazn aletea o late ms rpido o ms fuerte (palpitaciones)  Fiebre de 100.4 F (38 C) o ms, o segn le haya indicado su proveedor de atencin mdica   Tos con mucosidad de color oscuro o con sangre   2000-2020 [employer], [place]. [address], [place], [place] [address]. Todos los derechos reservados. Esta informacin no pretende sustituir la atencin Actor. Slo su mdico puede diagnosticar y tratar un problema de salud.

## 2020-06-09 NOTE — Addendum Note (Signed)
Addended by: Luvenia Heller on: 06/09/2020 02:59 PM     Modules accepted: Orders

## 2020-06-09 NOTE — Result Encounter Note (Signed)
Pt informed of COVID 19 results during visit. Negative

## 2020-06-09 NOTE — Result Encounter Note (Signed)
Pt informed of results; Negative COVID 19

## 2020-06-09 NOTE — Progress Notes (Signed)
Medications, allergies, vitals verified by Sabria Florido/CMA

## 2020-06-09 NOTE — Progress Notes (Signed)
Chief Complaint   Patient presents with   . Breathing Problem     Patient complains of dyspnea, dizziness, and feeling weak, after her 1st Pfizer injection. The patient states that she has is a chronic Asthmatic, and her appetite is normal.       SUBJECTIVE:    Debra Torres is an 59 year old female who presents with c/o SOB, dizziness, weakness x 1 week. Pt states symptoms started after getting her 1st Pfizer vaccine. Pt denies chest pain, fever/chills, wheezing, headache, neck pain. Pt states she has a hx of asthma.     Pt hx: Asthma, Chronic fatigue, Migraine, MS, Osteoporosis, Vertigo.     Interpreter used during visit. Spanish.        Outpatient Medications Prior to Visit   Medication Sig Dispense Refill   . albuterol (2.5 MG/3ML) 0.083% nebulizer solution Inhale 3 mL (2.5 mg) via nebulizer every 4 hours as needed for shortness of breath/wheezing. 90 mL 0   . albuterol HFA 108 (90 Base) MCG/ACT inhaler Inhale 2 puffs by mouth every 4 hours as needed for shortness of breath/wheezing. For wheezing and/or cough. 18 g 3   . beclomethasone HFA (Qvar RediHaler) 80 MCG/ACT inhaler Inhale 2 puffs by mouth 2 times a day. 10.6 g 1   . celecoxib 200 MG capsule Take 1 capsule (200 mg) by mouth daily as needed for pain. Take with food. 30 capsule 11   . Cholecalciferol (VITAMIN D) 2000 units Oral Cap Take 1 capsule (2,000 Units) by mouth daily. 90 capsule 3   . DULoxetine 60 MG DR capsule Take 1 capsule (60 mg) by mouth daily. 30 capsule 11   . Ergocalciferol (VITAMIN D OR) Take by mouth.     . gabapentin 100 MG capsule Take 1 capsule (100 mg) by mouth at bedtime. As needed for nerve pain. 30 capsule 11   . Ibuprofen (ADVIL OR)      . MAGNESIUM OR Take by mouth.     . meclizine 12.5 MG tablet Take 1 tablet (12.5 mg) by mouth 3 times a day as needed (vertigo). 30 tablet 11   . predniSONE 10 MG tablet Take 1 tablet (10 mg) by mouth as needed (asthma) for up to 30 doses. 30 tablet 0   . Spacer/Aero-Holding Chambers  (AeroChamber MV) miscellaneous Use as directed with metered dose inhaler. 1 each 0     No facility-administered medications prior to visit.          Review of patient's allergies indicates:  Allergies   Allergen Reactions   . Morphine    . Penicillins    . Tramadol          Social History     Tobacco Use   . Smoking status: Never Smoker   . Smokeless tobacco: Never Used   Substance Use Topics   . Alcohol use: No       Patient Active Problem List    Diagnosis Date Noted   . Other osteoporosis without current pathological fracture [M81.8] 10/30/2018   . Chronic fatigue [R53.82] 03/14/2017   . Urgency incontinence [N39.41] 03/14/2017   . Impaired functional mobility, balance, and endurance [Z74.09] 10/03/2016   . Multiple sclerosis (HCC) [G35] 09/18/2016   . Chronic migraine without aura without status migrainosus, not intractable [G43.709] 09/18/2016   . Vertigo [R42] 09/18/2016        I personally reviewed and confirmed the ROS, past medical history, past surgical history, family history and social history  in the record with the patient today. Medications reviewed with patient.      Review of Systems   Constitutional: Negative.  Negative for chills and fever.        Pt had Pfizer vaccine 1 week ago.    HENT: Negative.    Eyes: Negative.    Respiratory: Positive for shortness of breath. Negative for cough, wheezing and stridor.         Hx Asthma   Cardiovascular: Negative for chest pain.   Skin: Negative.    Neurological: Positive for dizziness, weakness and light-headedness.        Hx MS   Psychiatric/Behavioral: Negative for confusion.       OBJECTIVE:    BP 132/78   Pulse 60   Temp 36.8 C (Temporal)   Resp 16   Wt 53.1 kg (117 lb)   SpO2 98%   BMI 21.40 kg/m   Body mass index is 21.4 kg/m.    Physical Exam   Constitutional: She is oriented to person, place, and time. She appears well-developed and well-nourished. No distress.   HENT:   Head: Normocephalic and atraumatic.   Right Ear: External ear normal.    Left Ear: External ear normal.   Mouth/Throat: Oropharynx is clear and moist.   Eyes: Pupils are equal, round, and reactive to light. Conjunctivae are normal.   Neck: Normal range of motion. Neck supple.   Cardiovascular: Normal rate and regular rhythm. Exam reveals no gallop and no friction rub.   No murmur heard.  Pulmonary/Chest: Effort normal and breath sounds normal. No stridor. No respiratory distress. She has no wheezes. She has no rales.   BS bilaterally, no wheezing, stridor, good BS bilaterally.   Neurological: She is alert and oriented to person, place, and time. Coordination normal.   Skin: Skin is warm and dry. She is not diaphoretic. No erythema. No pallor.   Psychiatric: She has a normal mood and affect. Her behavior is normal. Thought content normal.   Nursing note and vitals reviewed.      ASSESSMENT/PLAN    (R06.02) Shortness of breath  (primary encounter diagnosis)  Plan: POC COVID-19 Qual Rapid PCR, Onsite [facility]), EKG        12-Lead    (R53.83) Other fatigue    (G35) Multiple sclerosis (HCC)    (T88.1XXA) Post-immunization reaction, initial encounter  EKG; sinus bradycardia 58, no ST elevation or depression noted.   Rapid COVID 19 test negative.    Pt denies CP, VS stable 132.78, HR 60 regular, SA02 98%  Plan; Pt hx of MS, Post immunization reaction.     1. Go directly to the ER for evaluation of SOB, fatigue, dizziness. Post immunization reaction, immunization Pfizer 1 week ago.     Discussed medications in detail including dosing, side effects and interactions.    Rosette Reveal, ARNP  [facility]   Urgent Care

## 2020-06-12 LAB — EKG 12 LEAD
Atrial Rate: 58 {beats}/min
P Axis: 67 degrees
P-R Interval: 160 ms
Q-T Interval: 440 ms
QRS Duration: 100 ms
QTC Calculation: 431 ms
R Axis: 71 degrees
T Axis: 47 degrees
Ventricular Rate: 58 {beats}/min

## 2020-06-28 ENCOUNTER — Other Ambulatory Visit (HOSPITAL_BASED_OUTPATIENT_CLINIC_OR_DEPARTMENT_OTHER): Payer: Self-pay

## 2020-06-28 ENCOUNTER — Ambulatory Visit (INDEPENDENT_AMBULATORY_CARE_PROVIDER_SITE_OTHER): Payer: Self-pay | Admitting: Physician Assistant

## 2020-06-28 VITALS — BP 124/84 | HR 78 | Temp 97.0°F | Resp 18 | Wt 120.0 lb

## 2020-06-28 DIAGNOSIS — Z1231 Encounter for screening mammogram for malignant neoplasm of breast: Secondary | ICD-10-CM

## 2020-06-28 DIAGNOSIS — T8062XS Other serum reaction due to vaccination, sequela: Secondary | ICD-10-CM | POA: Insufficient documentation

## 2020-06-28 DIAGNOSIS — J4541 Moderate persistent asthma with (acute) exacerbation: Secondary | ICD-10-CM

## 2020-06-28 DIAGNOSIS — T50Z95D Adverse effect of other vaccines and biological substances, subsequent encounter: Secondary | ICD-10-CM

## 2020-06-28 DIAGNOSIS — Z1211 Encounter for screening for malignant neoplasm of colon: Secondary | ICD-10-CM

## 2020-06-28 MED ORDER — PREDNISONE 10 MG OR TABS
10.0000 mg | ORAL_TABLET | ORAL | 0 refills | Status: DC | PRN
Start: 2020-06-28 — End: 2021-03-16
  Filled 2020-06-28: qty 30, 30d supply, fill #0

## 2020-06-28 NOTE — Progress Notes (Signed)
Chief Complaint   Patient presents with   . Follow-Up        HPI  Debra Torres is a 59 year old female presenting today for:    She was vaccinated for COVID on 06/03/20 this was the first dose of the COVID vaccine. The morning  after the vaccine was given she developed shortness of breath, light headedness, groggy feeling. She was seen in our urgent care one week after the vaccine was given. She was sent to the emergency department based on her symptoms. She was tested for COVID on 06/09/20 which was negative. She was told at the emergency department that she had a reaction to the vaccination and ay not want to receive the second dose of the vaccination. She states that the shortness of breath has gotten better, but is still short of breath both at rest and with exertions. She states that she is still feeling groggy mentally. She does have a history of asthma, but she states that this has been well controlled until she received the vaccination. She has been taking her beclomethasone inhaler twice daily and her albuterol inhaler as directed, but these have been mildly effective in alleviating her symptoms. She has been diagnosed with MS.    She is due for Pneumonia vaccination, but declining this today as she has not completely recovered from COVID vaccination.    ROS   As per HPI    Patient Active Problem List   Diagnosis   . Multiple sclerosis (HCC)   . Chronic migraine without aura without status migrainosus, not intractable   . Vertigo   . Impaired functional mobility, balance, and endurance   . Chronic fatigue   . Urgency incontinence   . Other osteoporosis without current pathological fracture      Outpatient Medications Prior to Visit   Medication Sig Dispense Refill   . albuterol (2.5 MG/3ML) 0.083% nebulizer solution Inhale 3 mL (2.5 mg) via nebulizer every 4 hours as needed for shortness of breath/wheezing. 90 mL 0   . albuterol HFA 108 (90 Base) MCG/ACT inhaler Inhale 2 puffs by mouth every 4 hours as  needed for shortness of breath/wheezing. For wheezing and/or cough. 18 g 3   . beclomethasone HFA (Qvar RediHaler) 80 MCG/ACT inhaler Inhale 2 puffs by mouth 2 times a day. 10.6 g 1   . celecoxib 200 MG capsule Take 1 capsule (200 mg) by mouth daily as needed for pain. Take with food. 30 capsule 11   . Cholecalciferol (VITAMIN D) 2000 units Oral Cap Take 1 capsule (2,000 Units) by mouth daily. 90 capsule 3   . DULoxetine 60 MG DR capsule Take 1 capsule (60 mg) by mouth daily. 30 capsule 11   . Ergocalciferol (VITAMIN D OR) Take by mouth.     . gabapentin 100 MG capsule Take 1 capsule (100 mg) by mouth at bedtime. As needed for nerve pain. 30 capsule 11   . Ibuprofen (ADVIL OR)      . MAGNESIUM OR Take by mouth.     . meclizine 12.5 MG tablet Take 1 tablet (12.5 mg) by mouth 3 times a day as needed (vertigo). 30 tablet 11   . predniSONE 10 MG tablet Take 1 tablet (10 mg) by mouth as needed (asthma) for up to 30 doses. 30 tablet 0   . Spacer/Aero-Holding Chambers (AeroChamber MV) miscellaneous Use as directed with metered dose inhaler. 1 each 0     No facility-administered medications prior to visit.  Social History     Socioeconomic History   . Marital status: Divorced     Spouse name: Not on file   . Number of children: Not on file   . Years of education: Not on file   . Highest education level: Not on file   Occupational History   . Not on file   Social Needs   . Financial resource strain: Not on file   . Food insecurity     Worry: Not on file     Inability: Not on file   . Transportation needs     Medical: Not on file     Non-medical: Not on file   Tobacco Use   . Smoking status: Never Smoker   . Smokeless tobacco: Never Used   Substance and Sexual Activity   . Alcohol use: No   . Drug use: No   . Sexual activity: Never   Lifestyle   . Physical activity     Days per week: Not on file     Minutes per session: Not on file   . Stress: Not on file   Relationships   . Social Wellsite geologist on phone: Not on  file     Gets together: Not on file     Attends religious service: Not on file     Active member of club or organization: Not on file     Attends meetings of clubs or organizations: Not on file     Relationship status: Not on file   . Intimate partner violence     Fear of current or ex partner: Not on file     Emotionally abused: Not on file     Physically abused: Not on file     Forced sexual activity: Not on file   Other Topics Concern   . Not on file   Social History Narrative    Spanish speaking. 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 Moved from [place] to [place] with her daughter.       Family History     Problem (# of Occurrences) Relation (Name,Age of Onset)    Cancer (1) Sister: cervical cancer    No Known Problems (2) Mother, Father       Negative family history of: Multiple Sclerosis           OBJECTIVE  Vitals:    06/28/20 0703   Temp: 36.1 C   Pulse: 78   BP: (!) 155/78   Resp: 18   SpO2: 97%   Weight: 54.4 kg (120 lb)      Physical Exam   Constitutional: She is oriented to person, place, and time and well-developed, well-nourished, and in no distress. No distress.   HENT:   Head: Normocephalic and atraumatic.   Eyes: EOM are normal.   Cardiovascular: Normal rate, regular rhythm, normal heart sounds and intact distal pulses. Exam reveals no gallop and no friction rub.   No murmur heard.  Pulmonary/Chest: Effort normal and breath sounds normal. No respiratory distress. She has no wheezes. She has no rales. She exhibits no tenderness.   Neurological: She is alert and oriented to person, place, and time.   Skin: Skin is warm and dry. She is not diaphoretic.   Psychiatric: Mood, memory, affect and judgment normal.   Nursing note and vitals reviewed.        ASSESSMENT/PLAN    1. Encounter for screening  mammogram for malignant neoplasm of breast  - Mammography Screening w/Tomo Bilateral; Future    2. Colon cancer screening  - OCCULT BLOOD BY IA, STL; Future    3.  Moderate persistent asthma with acute exacerbation  - predniSONE 10 MG tablet; Take 1 tablet (10 mg) by mouth as needed (asthma) for up to 30 doses.  Dispense: 30 tablet; Refill: 0  -continue Qvar and albuterol inhaler as you have been  Follow up with me if shortness of breath gets worse    4. Adverse effect of vaccine, subsequent encounter  - predniSONE 10 MG tablet; Take 1 tablet (10 mg) by mouth as needed (asthma) for up to 30 doses.  Dispense: 30 tablet; Refill: 0  -continue Qvar and albuterol inhaler as you have been  Follow up with me if shortness of breath gets worse  -Letter written excusing her from receiving any more COVID vaccinations due to high likelihood of allergic reaction to COVID vaccination.    I have spent a total time of 36 minutes on this visit today including chart review prior to the visit, face to face time with the patient, and documentation

## 2020-06-28 NOTE — Progress Notes (Signed)
Vitals, allergies, and medication reviewed by Terri Piedra, CMA, 06/28/2020 6:56 AM          Reason for visit: asthma        Have you seen a specialist since your last visit: NO     If  Name and location and date.     HM Due:   Health Maintenance   Topic Date Due   . Pneumococcal Vaccine: Pediatrics (0-5 years) and At-Risk Patients (6-64 years) (1 of 2 - PPSV23) Never done   . COVID-19 Vaccine (1) Never done   . Breast Cancer Screening  Never done   . Zoster Vaccine (1 of 2) Never done   . Colorectal Cancer Screening  01/07/2020   . Influenza Vaccine (1) 07/21/2020   . Depression Screening (PHQ-2)  02/10/2021   . DTaP, Tdap, and Td Vaccines (2 - Td) 10/21/2022   . Lipid Disorders Screening  12/29/2023   . Hepatitis C Screening  Completed   . HIV Screening  Completed   . Hepatitis A Vaccine  Aged Out   . Hepatitis B Vaccine  Aged Out              Future Appointments   Date Time Provider Department Center   06/28/2020  7:00 AM Charlyne Quale, [place] [facility]   08/11/2020  8:30 AM Dessa Phi, MD [facility]   08/18/2020  3:30 PM Harmon Dun, MD [facility]

## 2020-07-12 ENCOUNTER — Other Ambulatory Visit (HOSPITAL_BASED_OUTPATIENT_CLINIC_OR_DEPARTMENT_OTHER): Payer: Self-pay

## 2020-08-02 ENCOUNTER — Telehealth (HOSPITAL_BASED_OUTPATIENT_CLINIC_OR_DEPARTMENT_OTHER): Payer: Self-pay

## 2020-08-02 NOTE — Telephone Encounter (Signed)
Agree: ok to get mental health therapy.

## 2020-08-02 NOTE — Telephone Encounter (Signed)
Delma Freeze, a mental health therapist, who is working with Reita would like to make sure it is ok to proceed with therapy.    They will be doing some trauma therapy which can be distressing to pt's. She's wondering if this will be ok to move forward with her MS diagnosis. There will be no medications involved, just behavioral therapy.    Advised that there are no contraindications to therapy. However, intense emotional distress could trigger a pseudo-flare on occasion. If that were to occur, it would be advised for pt to do a less intense therapy course to ensure pt's comfort. Debarah Crape states understanding

## 2020-08-04 ENCOUNTER — Encounter (HOSPITAL_BASED_OUTPATIENT_CLINIC_OR_DEPARTMENT_OTHER): Payer: Self-pay | Admitting: Neurology

## 2020-08-07 ENCOUNTER — Encounter (HOSPITAL_BASED_OUTPATIENT_CLINIC_OR_DEPARTMENT_OTHER): Payer: Self-pay | Admitting: Physical Medicine & Rehabilitation

## 2020-08-11 ENCOUNTER — Encounter (HOSPITAL_BASED_OUTPATIENT_CLINIC_OR_DEPARTMENT_OTHER): Payer: Self-pay | Admitting: Physical Medicine & Rehabilitation

## 2020-08-11 ENCOUNTER — Ambulatory Visit: Payer: Self-pay | Attending: Physical Medicine & Rehabilitation | Admitting: Physical Medicine & Rehabilitation

## 2020-08-11 ENCOUNTER — Other Ambulatory Visit (HOSPITAL_BASED_OUTPATIENT_CLINIC_OR_DEPARTMENT_OTHER): Payer: Self-pay

## 2020-08-11 VITALS — BP 108/70 | HR 73 | Temp 98.0°F | Ht 62.0 in | Wt 121.0 lb

## 2020-08-11 DIAGNOSIS — G35 Multiple sclerosis: Secondary | ICD-10-CM | POA: Insufficient documentation

## 2020-08-11 DIAGNOSIS — M792 Neuralgia and neuritis, unspecified: Secondary | ICD-10-CM

## 2020-08-11 DIAGNOSIS — G43709 Chronic migraine without aura, not intractable, without status migrainosus: Secondary | ICD-10-CM

## 2020-08-11 DIAGNOSIS — M1990 Unspecified osteoarthritis, unspecified site: Secondary | ICD-10-CM

## 2020-08-11 MED ORDER — CELECOXIB 200 MG OR CAPS
200.0000 mg | ORAL_CAPSULE | Freq: Every day | ORAL | 11 refills | Status: DC | PRN
Start: 2020-08-11 — End: 2021-07-06
  Filled 2020-08-11: qty 30, 30d supply, fill #0

## 2020-08-11 MED ORDER — GABAPENTIN 100 MG OR CAPS
100.0000 mg | ORAL_CAPSULE | Freq: Every evening | ORAL | 11 refills | Status: DC
Start: 2020-08-11 — End: 2021-07-06
  Filled 2020-08-11: qty 30, 30d supply, fill #0

## 2020-08-11 MED ORDER — DULOXETINE HCL 60 MG OR CPEP
60.0000 mg | DELAYED_RELEASE_CAPSULE | Freq: Every day | ORAL | 11 refills | Status: DC
Start: 2020-08-11 — End: 2021-01-12
  Filled 2020-08-11: qty 30, 30d supply, fill #0

## 2020-08-11 NOTE — Progress Notes (Signed)
Physical Medicine and Rehabilitation Note    Chief Complaint: multiple sclerosis    Background neuro history (saw Dr. Elenor Legato):   Diagnosis of RRMS: 2004 based on headaches, balance problems, double vision, MRI brain; no spinal cord lesions; LP neg  Current DMT: None  Past DMT: Betaseron 2004-2010 (stopped after prior neurologist moved away and concern that this is not relapsing MS).    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 headaches. Happened frequently before starting to Betaseron, less while on the drug.         Had weakness and inability to breath on IV steroids x 3 days (?) before  Imaging:    MRI Brain/C/T spine stable 04/2019     MRI brain 01/18: As compared to 11/26/2013, multiple grossly unchanged foci of white matter signal abnormality within the brain, accounting for differences in technique. Relatively nonspecific and could be seen in setting of chronic ischemic vascular disease or demyelinating disease. No abnormal enhancement detected. No PML.     MRI brain 02/15: Stable white matter lesions periventricular and pericallosal.    MRI C spine 01/18: No cervical cord lesions. Stable focal T2 prolongation involving the thoracic spine at T2-T3 seen only on axial imaging and not well correlated on sagittal images. No new lesion or abnormal cord enhancement detected.     MRI C and T spine 04/15: No spinal cord lesions. Hyperintense lesion lower pole right kidney. C4-5 disc protrusion, effaces ventral thecal sac but does not deform the cord    Co-morbidities: positive anti-SSA, osteoporosis (DEXA 10/2018, T score -2.9 at spine, L hip -1.8, L fem neck -2.5)--takes calcium and vitamin D but can't afford additional treatment, asthma    SH: Spanish speaking. Lives w daughter and son-in-law. Only son in law working. Daughter has a young  daughter.    Results:  -02/2018 XR low back-mild grade 1 anterolisthesis of L5 on S1 and probable osteopenia  -11/09/2018 EMG did not show L median/ulnar neuropathy or C8 radic    05/2020 Pfizer #1 (felt weak for 2 months afterwards, odd sensations in L face, felt weak in whole body)      Interval history:   -last seen 01/2020.      In interim-  -03/2020 saw Dr. Dayton Scrape, albuterol INH/NEB, depomedrol 80 mg injection  -05/2020 saw primary care- rec double QVAR, use 4 puffs albuterol for rescue    -05/2020 Pfizer #1. EKG sinus brady. Post immunization for SOB/fatigue/dizziness x 1 wk. PCP wrote excusing her from future vaccine doses, concern for allergy  Felt lasted for 2 months.  Had feeling that chest would explode. Felt pressure.   No rash, no throat swelling.  But had difficulty due to SOB and only drank soup. Had odd sensations in left face.  Felt weak in the whole body. Very dizzy.     -asthma: QVAR (works). Albuterol prn.     -neuro appt pending for 08/18/20.    -Celebrex prn (2 times per week): no stomach pain.    -not on meclizine. No vertigo.    -duloxetine not for months. celebrex and gabapentin helps enough.     -moving head to right, feels really bad.     -tried to restart working x 1 week. Just 4 hours for 4 days.    -loses balance still.     ------------------  Background rehab  history:  -Mobility: L arm/leg weakness. does lose balance esp crouching (at times falls forward/backwards). Vertigo, eye movements exacerbate, some migrainous component. Meclizine prn.   -ADLS: L hand numbness, easier to drop things from L hand. ADLs/IADLs take longer. Sits for showering.  -Equipment: no AD.  -Speech/swallow/Respiratory:   -Therapy:   -Exercise:    -heat sensitive  -Fatigue: yes.    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     -left side  decreased temperature sense. L hand 3rd-5th fingers altered sensation  -Pain: chronic migraine w vertiginous component. Ice helps. Celebrex 200 mg/ibuprofen prn. L-sided neuropathic pain. Joint pains. Gabapentin 100 mg qhs. In the past took Duloxetine 60 mg daily (helped w vertigo).  Past: nortriptyline (made her feel bad, felt short of breath)  -Spasticity: muscle pains likely neuropathic    -Skin:    -Bowel:   -Bladder: Urinary urgency. urinary incontinence. Nocturia. Uses pads.  Saw urogyn Dr. Elon Spanner  Past: tolterodine 2 mg bid (was too expensive, didn't take), oxybutynin (didn't work), solifenacin (too expensive)  03/14/17 voided 200 mL, PVR 0 mL.  -gyn: removal of R ovary and half of L ovary for R ovarian cyst. 1987 hysterectomy.    -Vision: 03/2018 saw Dr. Rogelia Boga (resident, ophthal)-full OCTs, Left pterygium causing astigmatism, 20/20, dry eyes. Follow up 6 months.        Current Outpatient Medications:     albuterol (2.5 MG/3ML) 0.083% nebulizer solution, Inhale 3 mL (2.5 mg) via nebulizer every 4 hours as needed for shortness of breath/wheezing., Disp: 90 mL, Rfl: 0    albuterol HFA 108 (90 Base) MCG/ACT inhaler, Inhale 2 puffs by mouth every 4 hours as needed for shortness of breath/wheezing. For wheezing and/or cough., Disp: 18 g, Rfl: 3    beclomethasone HFA (Qvar RediHaler) 80 MCG/ACT inhaler, Inhale 2 puffs by mouth 2 times a day., Disp: 10.6 g, Rfl: 1    celecoxib 200 MG capsule, Take 1 capsule (200 mg) by mouth daily as needed for pain. Take with food., Disp: 30 capsule, Rfl: 11    Cholecalciferol (VITAMIN D) 2000 units Oral Cap, Take 1 capsule (2,000 Units) by mouth daily., Disp: 90 capsule, Rfl: 3    DULoxetine 60 MG DR capsule, Take 1 capsule (60 mg) by mouth daily., Disp: 30 capsule, Rfl: 11    Ergocalciferol (VITAMIN D OR), Take by mouth., Disp: , Rfl:     gabapentin 100 MG capsule, Take 1 capsule (100 mg) by mouth at bedtime. As needed for nerve pain., Disp: 30 capsule, Rfl: 11     Ibuprofen (ADVIL OR), , Disp: , Rfl:     MAGNESIUM OR, Take by mouth., Disp: , Rfl:     meclizine 12.5 MG tablet, Take 1 tablet (12.5 mg) by mouth 3 times a day as needed (vertigo)., Disp: 30 tablet, Rfl: 11    predniSONE 10 MG tablet, Take 1 tablet (10 mg) by mouth as needed (asthma) for up to 30 doses., Disp: 30 tablet, Rfl: 0    Spacer/Aero-Holding Chambers (AeroChamber MV) miscellaneous, Use as directed with metered dose inhaler., Disp: 1 each, Rfl: 0      Physical exam  Vitals:    08/11/20 0820   BP: 108/70   Pulse: 73   Temp: 36.7 C     Constit:  NAD, seated in office chair.  Eyes: conj clear  ENMT: speech clear.  Resp: regular respirations, no acc muscle use  Abd: non-distended  Mental status: alert, answers questions appropriately.  Psych: very pleasant.    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    Multiple sclerosis  -advised to resume balance exercises for some vertigo with moving head, pt defers repeat PT at this time.     Vertigo/chronic migraine  -off duloxetine, encouraged to resume  In the past duloxetine helped her vertigo, ?vertiginous migraines.    Neuropathic pain  -refilled gabapentin    Arthritis  -refilled celebrex. She takes twice a week. Denies stomach pain.    Misc:  -after vaccine had constellation of symptoms that took 2 months to recover  Reassured her, her EKG looked okay except sinus brady (HR normal today)  Does not sound like she had anaphylaxis and had no rash.  That said, COVID history PLUS vaccination x 1 does likely provide her with some significant immunity    Follow up 6 months    Histories, medications and problem list have been personally  reviewed and updated as appropriate: YES.    TOTAL TIME: I have spent  52 minutes on the patient's care on the date of service, including chart review/pre-charting prior to the visit, face-to-face time with the patient, and documentation and coordination of care after the visit.

## 2020-08-11 NOTE — Patient Instructions (Addendum)
-  reanude la duloxetina por la maana. esto puede ayudar con el vrtigo.    -gabapentina, celebrex, duloxetina rellenados    clnica de retorno 6 meses

## 2020-08-18 ENCOUNTER — Ambulatory Visit: Payer: Self-pay | Attending: Neurology | Admitting: Neurology

## 2020-08-18 VITALS — Ht 62.0 in | Wt 121.0 lb

## 2020-08-18 DIAGNOSIS — G35 Multiple sclerosis: Secondary | ICD-10-CM

## 2020-08-18 NOTE — Progress Notes (Signed)
MULTIPLE SCLEROSIS DISTANT SITE TELEMEDICINE ENCOUNTER     This telemedicine visit was conducted via live interactive audio and video telecommunications.    Location of the patient: home, [place], [place]  Location of the provider: home office    Prior to the visit the risks and benefits were discussed with the patient and verbal consent obtained.       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 headaches. Happened frequently before starting to Betaseron, less while on the drug.         Had weakness and inability to breath on IV steroids (?) before  Imaging:     MRI brain 07/20: no significant change from previous     MRI brain 02/19: No significant interval change from MRI of head November 12, 2016, with an increase in the number of lesions since May 04, 2004. As previously stated, appearance is mildly atypical for demyelinating disease, but in the absence of vascular risk factors this remains the most likely diagnosis.     MRI brain 01/18: As compared to 11/26/2013, multiple grossly unchanged foci of white matter signal abnormality within the brain, accounting for differences in technique. Relatively nonspecific and could be seen in setting of chronic ischemic vascular disease or demyelinating disease. No abnormal enhancement detected. No PML.     MRI brain 02/15: Stable white matter lesions periventricular and pericallosal.       MRI C and T spine 07/20: no new/enhancing lesions, right hemicord lesion at T2-T3 difficult to see     MRI C and T  spine 02/19: Stable appearance of cervical and thoracic spine compared to February 15, 2014 November 12, 2016. As before, there is an equivocal small lesions seen in the right hemicord at T2-T3.    MRI C spine 01/18: No cervical cord lesions. Stable focal T2 prolongation involving the thoracic spine at T2-T3 seen only on axial imaging and not well correlated on sagittal images. No new lesion or abnormal cord enhancement detected.     MRI C and T spine 04/15: No spinal cord lesions. Hyperintense lesion lower pole right kidney. C4-5 disc protrusion, effaces ventral thecal sac but does not deform the cord    Interval History:   - Last visit with me was 08/16/2019  - Seen with Spanish interpreter per video    - Saw Dr. Aileen Fass last week  - She restarted Duloxetine, which patient had stopped  - Also had recommended to restart balance exercises    - Had Covid-19 infection in June 2020  - Covid Pfizer vaccine 06/03/2020  - First day after vaccine arm pain, second day fever  - Third day had shortness of breath and feeling as if her "heart was exploding" after covid vaccine  - Her PCP told her to not get second covid vaccine    - Feels like it has been harder to lift things    ROS:   Symptoms  Fatigue: Quite a bit  Sleep: Not at all  Double vision: Not at all  Blurry vision: A little bit  Swallowing problems: Not at all  Dizziness / light headedness: Quite a bit  Numbness or tingling or odd sensations: Not at all  Pain: Not at all  Weakness: Quite a bit  Falling: Not at all  Spasms or jerking: Not at all  Tightness or stiffness: Not at all    Exercise Vitals: Total Minutes of Exercise per Week: (!) 0       Physical Exam:   Ht 5\' 2"  (1.575 m)   Wt 54.9 kg (121 lb)   BMI 22.13 kg/m     General: in no acute distress    Eyes: no scleral icterus or pallor    Respiratory: breathing comfortably on room air    Cardiovascular: no lower extremity edema      Neurologic Exam:     Mental Status: alert & oriented, during today's encounter  normal attention, concentration, language, calm and appropriate in contact and mood balanced   Cranial Nerves: no nystagmus, no dysconjugate gaze, no INO, face symmetrical, no facial paresis, hearing intact to voice, speech fluent  Motor:    Tone: normal.     Atrophy/Fasiculations: not observed     Pronator Drift: none.     Strength: deferred  Reflexes: deferred  Coordination: finger to nose with no dysmetria  Gait and Balance: deferred  Sensory: deferred    Assessment and Plan:  Ms. Arrietty Dercole is a 59 year old woman multiple sclerosis and chronic headaches, currently on no DMT, presenting for re-evaluation.     1. Disease modifying therapy: Reviewed most recent MRI from 04/2019, which is unchanged from 2017. Clinically stable by neuro exam (history somewhat limited due to language and cultural barriers). Briefly discussed again option of starting safe DMT (glatiramer may be best option; patient also tolerated Betaseron well per her recollection, though I am somewhat concerned about increase in headaches on interferons). However, given her stable imaging off DMT (for 8 years) and lack of recent relapses, mutually decided to remain off DMT.   - No DMT at this time  - Plan repeat MRI brain, C and T spine before next visit  - Continue Vitamin D  - Not using tobacco  - Patient to contact our clinic with any new or worsening neurological problems lasting more than 24 hours    2. Symptom management:   - Vertigo: Physical Therapy at [facility] was very helpful but this is still a problem. At least some of her vertigo may be vestibular migraines. Did not try Meclizine. Provided rx for grab bars in shower as some of her vertigo attackes hav eled to falls in the shower.   - Bladder problems: Urge incontinence, which may be due to prior childbirth and s/p hysterectomy. Bladder scan normal in 2018. Oxybutynine did not help. Followed by Urology.   - Fatigue: TSH normal in 07/2015. CBC within normal limits. Did not trial  Amandine.    3. Migraines: Headaches well controlled currently. Stopped Nortriptyline due to shortness of breath  - Previously discussed risk of rebound/overuse headache  - Continue Duloxetine 60mg  po Qday    4. Covid-19 pandemic: Had Covid in June 2020, Pfizer vaccine 06/03/2020. Had shortness of breath 3 days following vaccine and PCP [birth year]1974 PA provided her with a letter of medical exemption for further Covid vaccinatio as he thinks this was an allergic reaction.       Return to see me in 4-6 months after MRI, sooner if needed    I  spent a total of 40 minutes for the patient's care on the date of the service.

## 2020-08-18 NOTE — Patient Instructions (Addendum)
-   Please call our clinic with any new or worsening neurological problems lasting more than 24 hours  - Plan repeat MRI before next visit  - Return to see me in 4-6 months, sooner if needed

## 2020-08-21 ENCOUNTER — Other Ambulatory Visit (HOSPITAL_BASED_OUTPATIENT_CLINIC_OR_DEPARTMENT_OTHER): Payer: Self-pay

## 2020-08-22 ENCOUNTER — Encounter (HOSPITAL_BASED_OUTPATIENT_CLINIC_OR_DEPARTMENT_OTHER): Payer: Self-pay

## 2020-08-24 ENCOUNTER — Other Ambulatory Visit: Payer: Self-pay

## 2020-09-12 ENCOUNTER — Other Ambulatory Visit (INDEPENDENT_AMBULATORY_CARE_PROVIDER_SITE_OTHER): Payer: Self-pay | Admitting: Physician Assistant

## 2020-09-12 DIAGNOSIS — Z1211 Encounter for screening for malignant neoplasm of colon: Secondary | ICD-10-CM

## 2020-09-13 LAB — OCCULT BLOOD BY IA, STL: Occult Bld 1 Result: NEGATIVE

## 2020-11-12 ENCOUNTER — Other Ambulatory Visit (HOSPITAL_BASED_OUTPATIENT_CLINIC_OR_DEPARTMENT_OTHER): Payer: Self-pay

## 2020-12-14 ENCOUNTER — Encounter (HOSPITAL_BASED_OUTPATIENT_CLINIC_OR_DEPARTMENT_OTHER): Payer: Self-pay

## 2020-12-15 ENCOUNTER — Encounter (HOSPITAL_BASED_OUTPATIENT_CLINIC_OR_DEPARTMENT_OTHER): Payer: Self-pay

## 2020-12-15 ENCOUNTER — Ambulatory Visit
Admission: RE | Admit: 2020-12-15 | Discharge: 2020-12-15 | Disposition: A | Discharge status: Home / Self Care | Payer: Self-pay | Attending: Physician Assistant | Admitting: Physician Assistant

## 2020-12-15 DIAGNOSIS — Z1231 Encounter for screening mammogram for malignant neoplasm of breast: Secondary | ICD-10-CM | POA: Insufficient documentation

## 2021-01-12 ENCOUNTER — Other Ambulatory Visit (HOSPITAL_BASED_OUTPATIENT_CLINIC_OR_DEPARTMENT_OTHER): Payer: Self-pay

## 2021-01-12 ENCOUNTER — Ambulatory Visit (HOSPITAL_BASED_OUTPATIENT_CLINIC_OR_DEPARTMENT_OTHER): Payer: Self-pay | Admitting: Physical Medicine & Rehabilitation

## 2021-01-12 ENCOUNTER — Ambulatory Visit
Admission: RE | Admit: 2021-01-12 | Discharge: 2021-01-12 | Disposition: A | Discharge status: Home / Self Care | Payer: Self-pay | Attending: Diagnostic Radiology | Admitting: Diagnostic Radiology

## 2021-01-12 VITALS — BP 125/71 | HR 71 | Temp 97.1°F | Ht 62.0 in | Wt 129.0 lb

## 2021-01-12 DIAGNOSIS — G43709 Chronic migraine without aura, not intractable, without status migrainosus: Secondary | ICD-10-CM | POA: Insufficient documentation

## 2021-01-12 DIAGNOSIS — H11002 Unspecified pterygium of left eye: Secondary | ICD-10-CM

## 2021-01-12 DIAGNOSIS — U099 Post covid-19 condition, unspecified: Secondary | ICD-10-CM

## 2021-01-12 DIAGNOSIS — M792 Neuralgia and neuritis, unspecified: Secondary | ICD-10-CM

## 2021-01-12 DIAGNOSIS — H5713 Ocular pain, bilateral: Secondary | ICD-10-CM

## 2021-01-12 DIAGNOSIS — H547 Unspecified visual loss: Secondary | ICD-10-CM | POA: Insufficient documentation

## 2021-01-12 DIAGNOSIS — G35 Multiple sclerosis: Secondary | ICD-10-CM | POA: Insufficient documentation

## 2021-01-12 MED ORDER — DULOXETINE HCL 60 MG OR CPEP
60.0000 mg | DELAYED_RELEASE_CAPSULE | Freq: Every day | ORAL | 11 refills | Status: DC
Start: 2021-01-12 — End: 2021-07-06
  Filled 2021-01-12: qty 30, 30d supply, fill #0

## 2021-01-12 NOTE — Progress Notes (Signed)
Physical Medicine and Rehabilitation Note    Chief Complaint: multiple sclerosis    Background neuro history (saw Dr. Elenor Legato):   Diagnosis of RRMS: 2004 based on headaches, balance problems, double vision, MRI brain; no spinal cord lesions; LP neg  Current DMT: None  Past DMT: Betaseron 2004-2010 (stopped after prior neurologist moved away and concern that this is not relapsing MS).    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 headaches. Happened frequently before starting to Betaseron, less while on the drug.         Had weakness and inability to breath on IV steroids x 3 days (?) before  Imaging:    MRI Brain/C/T spine stable 04/2019     MRI brain 01/18: As compared to 11/26/2013, multiple grossly unchanged foci of white matter signal abnormality within the brain, accounting for differences in technique. Relatively nonspecific and could be seen in setting of chronic ischemic vascular disease or demyelinating disease. No abnormal enhancement detected. No PML.     MRI brain 02/15: Stable white matter lesions periventricular and pericallosal.    MRI C spine 01/18: No cervical cord lesions. Stable focal T2 prolongation involving the thoracic spine at T2-T3 seen only on axial imaging and not well correlated on sagittal images. No new lesion or abnormal cord enhancement detected.     MRI C and T spine 04/15: No spinal cord lesions. Hyperintense lesion lower pole right kidney. C4-5 disc protrusion, effaces ventral thecal sac but does not deform the cord    Co-morbidities: positive anti-SSA, osteoporosis (DEXA 10/2018, T score -2.9 at spine, L hip -1.8, L fem neck -2.5)--takes calcium and vitamin D but can't afford additional treatment, asthma  Cholecystectomy.  10/2020 COVID19    SH: Spanish speaking. Lives w daughter and son-in-law. Only son in law working. Daughter has a  young daughter.    Results:  -02/2018 XR low back-mild grade 1 anterolisthesis of L5 on S1 and probable osteopenia  -11/09/2018 EMG did not show L median/ulnar neuropathy or C8 radic    05/2020 Pfizer #1 (felt weak for 2 months afterwards, odd sensations in L face, felt weak in whole body)-PCP excused her from further vaccines      Interval history:   -Last seen October 2021.  At that time recommended resuming duloxetine for vertigo and chronic migraine    In interim-  -07/2020 also saw Dr. Scot Jun Geldern-recommended shower grab bar    -plan for MRIs in April prior to seeing Dr. Elenor Legato May 2022    -fine in terms of MS, feels like her MS is stable    -10/2020 had COVID19 again. Didn't have to go to hospital. Didn't have cough. No fever.   Had nausea, SOB  Using QVAR and albuterol.    -reports off duloxetine for a while. Long time.    -using inhaler every 4-5 hours.   qvar 2 puffs twice a day    -no fevers    -vision: can't read small letters.  But can see far away normal.  Occasional needle sensation into both eyes, not associated with eye movements.   Worse since last year.   White color hurts eyes.   Denies loss of field of view.   No tunnel vision.   No rings around lights.     ------------------  Background rehab history:  -Mobility: L arm/leg weakness.  does lose balance esp crouching (at times falls forward/backwards). Vertigo, eye movements exacerbate, some migrainous component. Meclizine prn.   -ADLS: L hand numbness, easier to drop things from L hand. ADLs/IADLs take longer. Sits for showering.  -Equipment: no AD.  -Speech/swallow/Respiratory:   -Therapy:   -Exercise:    -heat sensitive  -Fatigue: yes.    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     -left side decreased temperature sense. L hand 3rd-5th fingers altered sensation  -Pain: chronic  migraine w vertiginous component. Ice helps. Celebrex 200 mg/ibuprofen prn. L-sided neuropathic pain. Joint pains. Gabapentin 100 mg qhs. In the past took Duloxetine 60 mg daily (helped w vertigo).  Past: nortriptyline (made her feel bad, felt short of breath)  -Spasticity: muscle pains likely neuropathic    -Skin:    -Bowel:   -Bladder: Urinary urgency. urinary incontinence. Nocturia. Uses pads.  Saw urogyn Dr. Elon Spanner  Past: tolterodine 2 mg bid (was too expensive, didn't take), oxybutynin (didn't work), solifenacin (too expensive)  03/14/17 voided 200 mL, PVR 0 mL.  -gyn: removal of R ovary and half of L ovary for R ovarian cyst. 1987 hysterectomy.    -Vision: 03/2018 saw Dr. Rogelia Boga (resident, ophthal)-full OCTs, Left pterygium causing astigmatism, 20/20, dry eyes. Follow up 6 months.        Current Outpatient Medications:     albuterol (2.5 MG/3ML) 0.083% nebulizer solution, Inhale 3 mL (2.5 mg) via nebulizer every 4 hours as needed for shortness of breath/wheezing., Disp: 90 mL, Rfl: 0    albuterol HFA 108 (90 Base) MCG/ACT inhaler, Inhale 2 puffs by mouth every 4 hours as needed for shortness of breath/wheezing. For wheezing and/or cough., Disp: 18 g, Rfl: 3    beclomethasone HFA (Qvar RediHaler) 80 MCG/ACT inhaler, Inhale 2 puffs by mouth 2 times a day., Disp: 10.6 g, Rfl: 1    celecoxib 200 MG capsule, Take 1 capsule (200 mg) by mouth daily with food as needed for pain., Disp: 30 capsule, Rfl: 11    Cholecalciferol (VITAMIN D) 2000 units Oral Cap, Take 1 capsule (2,000 Units) by mouth daily., Disp: 90 capsule, Rfl: 3    DULoxetine 60 MG DR capsule, Take 1 capsule (60 mg) by mouth daily., Disp: 30 capsule, Rfl: 11    Ergocalciferol (VITAMIN D OR), Take by mouth., Disp: , Rfl:     gabapentin 100 MG capsule, Take 1 capsule (100 mg) by mouth at bedtime as needed for nerve pain., Disp: 30 capsule, Rfl: 11    Ibuprofen (ADVIL OR), , Disp: , Rfl:     MAGNESIUM OR, Take by mouth., Disp: , Rfl:      meclizine 12.5 MG tablet, Take 1 tablet (12.5 mg) by mouth 3 times a day as needed (vertigo)., Disp: 30 tablet, Rfl: 11    predniSONE 10 MG tablet, Take 1 tablet (10 mg) by mouth as needed (asthma) for up to 30 doses., Disp: 30 tablet, Rfl: 0    Spacer/Aero-Holding Chambers (AeroChamber MV) miscellaneous, Use as directed with metered dose inhaler., Disp: 1 each, Rfl: 0      Physical exam  Vitals:    01/12/21 0828   BP: 125/71   Pulse: 71   Temp: 36.2 C       Constit: NAD, seated in office chair.  Eyes: conj clear. R eye ~20/40.  L eye ~20/70.  ENMT: speech clear.  Resp: regular respirations, no acc muscle use. Has wheeze in R lower chest posteriorly. And coughs multiple times.    Abd: non-distended  Mental status: alert, answers questions appropriately.  Psych: very pleasant.        Assessment/Plan:  60 yo F with relapsing remitting multiple sclerosis affecting brain and spine, not on DMT.    Post COVID19 syndrome  -Patient reports that she contracted COVID-19 a second time in January 2022 and has been short of breath ever since.  She is regularly using her Qvar and albuterol inhalers  -Rec to consult the primary care doctor for shortness of breath given asthma and recent covid and rule out heart problems due to COVID19 as well  - you can increase albuterol temporarily to 2 to 4 inhalations every 1 to 4 hours as needed, but you really need to discuss the next steps with PCP  -In the right lower lung there is a little wheezing  Your chest X-ray today looked good without pneumonia, but you should still see your PCP for shortness of breath.  You have some atherosclerosis in your aorta and you should also talk to your primary care doctor about that.  -fyi I'm not sure you really had a true allergy to the COVID19 vaccine    Vertigo/chronic migraine  -Recommend restarting duloxetine for dizziness  -Some suspicion for vestibular migraines in the past, responding to duloxetine  -She feels that the vertigo worsened due to  COVID-19    Bilateral eye pain not associated with eye movement.  Bilateral reduced visual acuity (worse compared to last examination by ophthalmology).  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  She denies fading of colors.  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    TOTAL TIME: I have spent  45 minutes on the patient's care on the date of service, including chart review/pre-charting prior to the visit, face-to-face time with the patient, and documentation and coordination of care after the visit.

## 2021-01-12 NOTE — Result Encounter Note (Signed)
Your chest x-ray looked good without any pneumonia, but you should still see your PCP for the shortness of breath. You do have some atherosclerosis in the aorta and should also talk with your primary care doctor about that

## 2021-01-12 NOTE — Patient Instructions (Addendum)
-  consulte al mdico de atencin primaria por dificultad para respirar dado asma y covid reciente y descartar problemas cardacos por COVID19    -obtener radiografa de trax en el hospital en el 2do piso    -No estoy seguro de que realmente haya tenido una verdadera alergia a la vacuna COVID19    -Recomendar reiniciar la duloxetina para los mareos    -En el pulmn inferior derecho hay un poco de sibilancia    - puede aumentar el albuterol temporalmente a 2 a 4 inhalaciones cada 1 a 4 horas segn sea necesario, pero realmente necesita discutir los prximos pasos con PCP  -Su radiografa de trax se vea bien sin neumona, pero an as Mer[employer] officer a su PCP por dificultad para respirar.  Usted tiene algo de aterosclerosis en la aorta y tambin debe hablar con su mdico de atencin primaria sobre eso.    -ver al oftalmlogo para sus problemas con la lectura  tel:[phone]    seguimiento 6 meses

## 2021-02-02 ENCOUNTER — Ambulatory Visit (HOSPITAL_BASED_OUTPATIENT_CLINIC_OR_DEPARTMENT_OTHER)
Admit: 2021-02-02 | Discharge: 2021-02-02 | Disposition: A | Discharge status: Home / Self Care | Payer: Self-pay | Source: Home / Self Care

## 2021-02-02 ENCOUNTER — Ambulatory Visit
Admission: RE | Admit: 2021-02-02 | Discharge: 2021-02-02 | Disposition: A | Discharge status: Home / Self Care | Payer: Self-pay | Attending: Diagnostic Radiology | Admitting: Diagnostic Radiology

## 2021-02-02 DIAGNOSIS — G35 Multiple sclerosis: Secondary | ICD-10-CM

## 2021-02-02 MED ORDER — GADOTERIDOL 279.3 MG/ML IV SOLN
11.0000 mL | Freq: Once | INTRAVENOUS | Status: AC | PRN
Start: 2021-02-02 — End: 2021-02-02
  Administered 2021-02-02: 5.5 mmol via INTRAVENOUS

## 2021-02-23 ENCOUNTER — Ambulatory Visit: Payer: Self-pay | Attending: Internal Medicine | Admitting: Internal Medicine

## 2021-02-23 DIAGNOSIS — H5713 Ocular pain, bilateral: Secondary | ICD-10-CM | POA: Insufficient documentation

## 2021-02-23 DIAGNOSIS — H11002 Unspecified pterygium of left eye: Secondary | ICD-10-CM | POA: Insufficient documentation

## 2021-02-23 DIAGNOSIS — G35 Multiple sclerosis: Secondary | ICD-10-CM | POA: Insufficient documentation

## 2021-02-23 DIAGNOSIS — H547 Unspecified visual loss: Secondary | ICD-10-CM | POA: Insufficient documentation

## 2021-02-23 NOTE — Addendum Note (Signed)
Addended by: Rodena Medin on: 02/23/2021 05:06 PM     Modules accepted: Orders

## 2021-02-23 NOTE — Progress Notes (Signed)
Chief complaint: h/o multiple sclerosis, no ocular involvement. F/up pteryigum     HPI:   60yo F with multiple sclerosis presents for pterygium evaluation     Patient was diagnosed with RRMS in 2004 based on -- headaches, balance problems, double vision, MRI brain; no spinal cord lesions; LP neg.  Patient is followed by Neurology (Dr. Si Gaul and Dr. Elenor Legato), last seen by them 03/13/18.  She had repeat imaging 11/2017, overall imaging has been stable off DMT for about 8 years.     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 headaches. Happened frequently before starting to Betaseron, less while on the drug.    Today pt presents after being lost to followup for a worsening pterygium. States has had worsening irritation of the left eye, notes vision has fluctuated in the left eye since last visit. No other visual changes.     POcHx:   Glasses    Oc Meds:  None    PMHx:        Active Ambulatory Problems     Diagnosis Date Noted   . Multiple sclerosis (HCC) 09/18/2016   . Chronic migraine without aura without status migrainosus, not intractable 09/18/2016   . Vertigo 09/18/2016   . Impaired functional mobility, balance, and endurance 10/03/2016   . Chronic fatigue 03/14/2017   . Urgency incontinence 03/14/2017     Resolved Ambulatory Problems     Diagnosis Date Noted   . No Resolved Ambulatory Problems          Past Medical History:   Diagnosis Date   . Headache    . MS (multiple sclerosis) (HCC)        PSurgHx:   No past surgical history on file.    Meds:     Current Outpatient Medications:   .   Celecoxib 100 MG Oral Cap, Take 1 capsule (100 mg) by mouth daily as needed for pain. Take with food., Disp: 30 capsule, Rfl: 5  .  Cholecalciferol (VITAMIN D) 2000 units Oral Cap, Take 1 capsule (2,000 Units) by mouth daily., Disp: 90 capsule, Rfl: 3  .  DULoxetine HCl 60 MG Oral CAPSULE ENTERIC COATED PARTICLES, Take 1 capsule (60 mg) by mouth daily., Disp: 30 capsule, Rfl: 5  .  Gabapentin 100 MG Oral Cap, Take 1-3 capsules at night and 1 capsule in the morning., Disp: 360 capsule, Rfl: 3  .  Hyoscyamine Sulfate 0.125 MG Sublingual SL Tab, Place 1 tablet (0.125 mg) under the tongue every 4 hours as needed (incontinence)., Disp: 30 tablet, Rfl: 6  .  Ibuprofen (ADVIL OR), , Disp: , Rfl:   .  meclizine 12.5 MG Oral Tab, Take 1 tablet (12.5 mg) by mouth 3 times a day as needed for dizziness., Disp: 60 tablet, Rfl: 5    Allergies:  Morphine, tramadol, PCN    Soc Hx:   Denies smoking, alcohol, or illicit drug use.     Family Hx:  No family history of eye disease, denies history of glaucoma, macular degeneration or blindness    ROS: Comprehensive review of systems performed  and All negative except for HPI    IMAGING:   MRI brain w/wo contrast 11/2017  1. No significant interval change from MRI of head November 12, 2016, with an   increase in the number of lesions since May 04, 2004. As previously stated,   appearance is mildly atypical for demyelinating disease, but in the absence of  vascular risk factors this remains the most likely diagnosis.    MRI Tspine w contrast 11/2017  1. Stable appearance of cervical and thoracic spine compared to February 15, 2014  November 12, 2016. As before, there is an equivocal small lesions seen in the   right hemicord at T2-T3.    MRI Cspine w contrast 11/2017  1. Stable appearance of cervical and thoracic spine compared to February 15, 2014  November 12, 2016. As before, there is an equivocal small lesions seen in the   right hemicord at T2-T3.    OCT nerve 04/07/18  G103/101, full  both eyes    Pentacam 02/23/21: 18.5 diopters of mostly with the rule astigmatism, flat axis at axis of pterygium     Assessment/Plan:  1. h/o multiple sclerosis  -followed by Dr. Elenor Legato (neurology)  -MRI stable from 2017 off DMT  -no prior history of optic neuritis per patient and records  -no evidence of optic nerve edema or pallor, OCT full previously   -DFE wnl   -OCT nerve next visit. 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 Lonn Georgia for consideration of surgeyr   3. Dry Eye LE  -pterygium-surface related  -start ATs QID    RTC 2-3 weeks to Dr. Lonn Georgia for consideration to schedule pterygium removal,  OCT nerve     Fara Chute, MD   R4  Department of Ophthalmology   [facility]

## 2021-03-07 ENCOUNTER — Ambulatory Visit: Payer: Self-pay | Attending: Ophthalmology | Admitting: Unknown Physician Specialty

## 2021-03-07 DIAGNOSIS — G35 Multiple sclerosis: Secondary | ICD-10-CM

## 2021-03-07 NOTE — Progress Notes (Signed)
SD/OCT OPTIC NERVE performed in clinic, both eyes.  Images/reports stored on [facility].  Marland Kitchenoc

## 2021-03-07 NOTE — Progress Notes (Signed)
Chief complaint: h/o multiple sclerosis, no ocular involvement. F/up pteryigum     HPI:   60yo F with multiple sclerosis presents for pterygium evaluation     Patient was diagnosed with RRMS in 2004 based on -- headaches, balance problems, double vision, MRI brain; no spinal cord lesions; LP neg.  Patient is followed by Neurology (Dr. Si Gaul and Dr. Elenor Legato), last seen by them 03/13/18.  She had repeat imaging 11/2017, overall imaging has been stable off DMT for about 8 years.     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 headaches. Happened frequently before starting to Betaseron, less while on the drug.    Today pt presents for evaluation for possible pterygium removal. States has had worsening irritation of the left eye, notes vision has fluctuated in the left eye since last visit. No other visual changes.     POcHx:   Glasses    Oc Meds:  None    PMHx:        Active Ambulatory Problems     Diagnosis Date Noted   . Multiple sclerosis (HCC) 09/18/2016   . Chronic migraine without aura without status migrainosus, not intractable 09/18/2016   . Vertigo 09/18/2016   . Impaired functional mobility, balance, and endurance 10/03/2016   . Chronic fatigue 03/14/2017   . Urgency incontinence 03/14/2017          Resolved Ambulatory Problems     Diagnosis Date Noted   . No Resolved Ambulatory Problems          Past Medical History:   Diagnosis Date   . Headache    . MS (multiple sclerosis) (HCC)        PSurgHx:   No past surgical history on file.    Meds:     Current Outpatient Medications:   .   Celecoxib 100 MG Oral Cap, Take 1 capsule (100 mg) by mouth daily as needed for pain. Take with food., Disp: 30 capsule, Rfl: 5  .  Cholecalciferol (VITAMIN D) 2000 units Oral Cap, Take 1 capsule (2,000 Units) by mouth daily., Disp: 90 capsule, Rfl: 3  .  DULoxetine HCl 60 MG Oral CAPSULE ENTERIC COATED PARTICLES, Take 1 capsule (60 mg) by mouth daily., Disp: 30 capsule, Rfl: 5  .  Gabapentin 100 MG Oral Cap, Take 1-3 capsules at night and 1 capsule in the morning., Disp: 360 capsule, Rfl: 3  .  Hyoscyamine Sulfate 0.125 MG Sublingual SL Tab, Place 1 tablet (0.125 mg) under the tongue every 4 hours as needed (incontinence)., Disp: 30 tablet, Rfl: 6  .  Ibuprofen (ADVIL OR), , Disp: , Rfl:   .  meclizine 12.5 MG Oral Tab, Take 1 tablet (12.5 mg) by mouth 3 times a day as needed for dizziness., Disp: 60 tablet, Rfl: 5    Allergies:  Morphine, tramadol, PCN    Soc Hx:   Denies smoking, alcohol, or illicit drug use.     Family Hx:  No family history of eye disease, denies history of glaucoma, macular degeneration or blindness    ROS: Comprehensive review of  systems performed and All negative except for HPI    IMAGING:   MRI brain w/wo contrast 11/2017  1. No significant interval change from MRI of head November 12, 2016, with an   increase in the number of lesions since May 04, 2004. As previously stated,   appearance is mildly atypical for demyelinating disease, but in the absence of  vascular risk factors this remains the most likely diagnosis.    MRI Tspine w contrast 11/2017  1. Stable appearance of cervical and thoracic spine compared to February 15, 2014  November 12, 2016. As before, there is an equivocal small lesions seen in the   right hemicord at T2-T3.    MRI Cspine w contrast 11/2017  1. Stable appearance of cervical and thoracic spine compared to February 15, 2014  November 12, 2016. As before, there is an equivocal small lesions seen in the   right hemicord at T2-T3.    OCT nerve 04/07/18  G103/101, full  both eyes    Pentacam 02/23/21: 18.5 diopters of mostly with the rule astigmatism, flat axis at axis of pterygium     OCT RNFL 03/07/21  OD: G100, no thinning, stable  OS: G98, borderline T, stable    Assessment/Plan:  1. h/o multiple sclerosis  -followed by Dr. 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 Discussed that she can seek for second opinion outside with potentially cheaper price.  -RTC 1 year for surface check     3. Dry Eye LE  -pterygium-surface related  -start ATs QID    RTC 1 year for OCT RNFL and HVF 24-2    Discussed with Dr. Chelsea Primus, MD  PGY-4 Ophthalmology

## 2021-03-08 NOTE — Progress Notes (Signed)
Attending Note:  I did not see the patient but have reviewed the note and agree with the assessment and plan.    Hatley Henegar T. Shritha Bresee, MD, PhD  Attending Physician   Division of Comprehensive Ophthalmology   [facility]

## 2021-03-08 NOTE — Progress Notes (Signed)
I was not asked to see the patient, but have reviewed the note and agree with the assessment and plan.

## 2021-03-16 ENCOUNTER — Ambulatory Visit: Payer: Self-pay | Attending: Neurology | Admitting: Neurology

## 2021-03-16 ENCOUNTER — Other Ambulatory Visit (HOSPITAL_BASED_OUTPATIENT_CLINIC_OR_DEPARTMENT_OTHER): Payer: Self-pay

## 2021-03-16 VITALS — Ht 62.0 in | Wt 124.0 lb

## 2021-03-16 DIAGNOSIS — G43809 Other migraine, not intractable, without status migrainosus: Secondary | ICD-10-CM | POA: Insufficient documentation

## 2021-03-16 DIAGNOSIS — Z5181 Encounter for therapeutic drug level monitoring: Secondary | ICD-10-CM | POA: Insufficient documentation

## 2021-03-16 DIAGNOSIS — G9519 Other vascular myelopathies: Secondary | ICD-10-CM | POA: Insufficient documentation

## 2021-03-16 DIAGNOSIS — M546 Pain in thoracic spine: Secondary | ICD-10-CM | POA: Insufficient documentation

## 2021-03-16 MED ORDER — RIZATRIPTAN BENZOATE 10 MG OR TBDP
10.0000 mg | ORAL_TABLET | Freq: Once | ORAL | 5 refills | Status: AC | PRN
Start: 2021-03-16 — End: 2022-03-16
  Filled 2021-03-16: qty 10, 5d supply, fill #0
  Filled 2021-07-09: qty 10, 5d supply, fill #1

## 2021-03-16 NOTE — Patient Instructions (Addendum)
-   Please call our clinic with any new or worsening neurological problems lasting more than 24 hours    For the brain pain:  - Take Rizaptriptan 1 tab dissolves in mouth as needed  - Can take Ibuprofen/Naproxen or similar pain med with this  - If that does not help, let me know and we may want you to see the Headache Clinic    For the numbness in your legs  - Physical Therapy at [facility]  - We may do an MRI of your lower back in the future if it does not get better    - Return to see me in 6 months, sooner if needed

## 2021-03-16 NOTE — Progress Notes (Signed)
MULTIPLE SCLEROSIS CENTER FOLLOW-UP NOTE       Distant Site Telemedicine Encounter    I conducted this encounter from [facility] Home via secure, live, face-to-face video conference with the patient. Debra Torres was located at Home.  I reviewed the risks and benefits of telemedicine as pertinent to this visit and the patient agreed to proceed.      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 headaches. Happened frequently before starting to Betaseron, less while on the drug.         Had weakness and inability to breath on IV steroids (?) before  Imaging:     MRI brain 01/2021: As before, lesions are somewhat atypical for demyelinating disease, although this cannot be excluded. There is no significant interval change from April 30, 2019. Recommend obtaining susceptibility weighted imaging on future scans to clarify diagnosis.      MRI brain 07/20: No significant change from previous     MRI brain 02/19: No significant interval change from MRI of head November 12, 2016, with an increase in the number of lesions since May 04, 2004. As previously stated, appearance is mildly atypical for demyelinating disease, but in the absence of vascular risk factors this remains the most likely diagnosis.     MRI brain 01/18: As compared to 11/26/2013, multiple grossly unchanged foci of white matter signal abnormality within the brain, accounting for differences in technique. Relatively nonspecific and could be seen in setting of chronic ischemic vascular disease or  demyelinating disease. No abnormal enhancement detected. No PML.     MRI brain 02/15: Stable white matter lesions periventricular and pericallosal.       MRI C and T spine 01/2021: No evidence of demyelinating disease in the cervical or thoracic spinal cord. No significant foraminal or central narrowing. Effacement of ventral CSF at C5-C6, straightening of normal lordosis as before.     MRI C and T spine 07/20: no new/enhancing lesions, right hemicord lesion at T2-T3 difficult to see     MRI C and T spine 02/19: Stable appearance of cervical and thoracic spine compared to February 15, 2014 November 12, 2016. As before, there is an equivocal small lesions seen in the right hemicord at T2-T3.    MRI C spine 01/18: No cervical cord lesions. Stable focal T2 prolongation involving the thoracic spine at T2-T3 seen only on axial imaging and not well correlated on sagittal images. No new lesion or abnormal cord enhancement detected.     MRI C and T spine 04/15: No spinal cord lesions. Hyperintense lesion lower pole right kidney. C4-5 disc protrusion, effaces ventral thecal sac but does not deform the cord    Interval History:   - Last visit with me was 08/18/2020  - Seen with Spanish interpreter per video    - At last visit made no changes to medications and ordered MRI  - MRI did not show the previously reported spine lesions   - No changes on MRI brain and radiologist recommended to include susceptibility  weighted imaging on future imaging    - Was in bed last weekend as her "brain was hurting", vertigo, and dizziness  - Brain pain is headache on the Left side, sensitive to light as well  - Tried Meclizine 12.5 once at the very beginning and then prochlorperazine, which did not help  - Also took celebrex for the pain  - Has never tried Sumatriptan or Rizatriptan  - She was very stressed before this happened  - Has about 2-3 headaches a month    - 3 months ago started to have problems with her bilateral legs: walking or  standing and suddenly can not feel her legs, lasts a few hours   - Not painful in the legs  - Has pain in her mid back  - Sitting down helps  - Feels like she may fall but has not had a fall  - More constipated, takes longer to relax to get stool out  - Continues to have urinary leakage (mainly with coughing etc). Tried Oxybutyinin, Solifenacin, Tolteradine and pelvic floor exercises for this in the past.   - Walks a lot and does yoga    ROS:   Symptoms  Fatigue: Very much  Sleep: Not at all  Double vision: Not at all  Blurry vision: Quite a bit  Swallowing problems: Quite a bit (when not feeling well cannot have solids)  Dizziness / light headedness: Very much  Numbness or tingling or odd sensations: Quite a bit (more when laying down)  Pain: Not at all  Weakness: Very much  Falling: Not at all  Spasms or jerking: Not at all  Tightness or stiffness: Not at all  Bladder problems: A little bit  Bowel problems: A little bit (recently starting to)  Sexual problems: Not at all (n/a)  Depression: A little bit (only when feeling unwell)  Anxiety: A little bit (when feeling unwell)  Problems thinking: Not at all  Heat sensitivity: Quite a bit  Skin problems (e.g., injection site reaction): Quite a bit (swells and gets bumps)  Heart palpitations: Quite a bit  Shortness of breath: Quite a bit  Other: when walking feels like she cannot feel her legs from hip down mainly the left leg    Exercise Vitals: Total Minutes of Exercise per Week: (!) 90     Physical Exam:   Ht 5\' 2"  (1.575 m)    Wt 56.2 kg (124 lb) Comment: verbal   BMI 22.68 kg/m     General: in no acute distress    Eyes: no scleral icterus or pallor    Respiratory: breathing comfortably on room air      Neurologic Exam:     Mental Status: alert & oriented, during today's encounter normal attention, concentration, language, calm and appropriate in contact and mood balanced   Cranial Nerves: no nystagmus, no dysconjugate gaze, no INO, face symmetrical, no facial  paresis, hearing intact to voice, speech fluent  Motor:    Tone: normal.     Atrophy/Fasiculations: not observed     Pronator Drift: none.     Strength: deferred  Reflexes: deferred  Coordination: finger to nose with no dysmetria  Gait and Balance: deferred  Sensory: deferred    Assessment and Plan:  [name] is a 60 year old woman with possible multiple sclerosis and chronic headaches, currently on no DMT, presenting for re-evaluation.     1. Disease modifying therapy: Clinically stable. Reviewed most recent MRI from 01/2021 today, which is unchanged  from prior. However, given her stable imaging off DMT (for 8 years) and lack of definite relapses (and some diagnostic uncertainty), mutually agree to hold off on DMT.   - No DMT at this time  - Plan repeat MRI brain w/wo Gd in 01/2022 - will include susceptibility weighted imaging   - Patient to contact our clinic with any new or worsening neurological problems lasting more than 24 hours  - Continue Vitamin D    2. Vertigo: Physical Therapy at [facility] was very helpful in the past. At least some of her vertigo may be vestibular migraines. Promethazine was not helpful  - Encouraged her to use Meclizine 12.5 mg po TID prn (patient thought this was to be taken at onset but not once she has vertigo)    3. Migraines: Stopped Nortriptyline due to shortness of breath. 2-3 per month.   - Again discussed risk of rebound/overuse headache  - Continue Duloxetine 60mg  po Qday  - Consider adding Topiramate if these become more frequent  - Start Rizatriptan 10mg  po prn migraine - risks and side effects discussed  - Can also take Ibuprofen or Naproxen prn  - Plan referral to Headache Clinic, if she gets more frequent migraines or the abortive treatments are not sufficient    4. Bladder problems: Stress incontinence, which may be due to prior childbirth and s/p hysterectomy. Bladder scan normal in 2018. Oxybutynine and Tolterodine did not help.   - Encouraged follow up with  Urology.     5. Fatigue: TSH normal in 07/2015. CBC within normal limits. Did not trial Amandine.    6. Numbness bilateral legs: Occurs only when walking or standing, lasts a few hours, sitting helps it resolve, no pain in legs but pain mid-back. Overall most concerning for neurogenic claudication.   - Referral to [facility]  - Consider MRI L spine if she develops weakness or bowel/bladder changes or PT is not sufficient    4. Covid-19 pandemic: Had Covid in June 2020, Pfizer vaccine 06/03/2020. Had shortness of breath 3 days following vaccine and PCP advised her against further Covid vaccination. I am not sure that her symptoms represent an allergic reaction to the vaccine and encouraged her to consider discussing this with ID/allergist, which she prefers not to do at present.       Return to see me in 6 months, sooner if needed    I spent a total of 67 minutes for the patient's care on the date of the service: 9:00-9:10 precharting, 9:10-10:04 face to face time, 23:38-23:52 documentation and coordination of care.

## 2021-03-17 ENCOUNTER — Other Ambulatory Visit (HOSPITAL_BASED_OUTPATIENT_CLINIC_OR_DEPARTMENT_OTHER): Payer: Self-pay

## 2021-03-20 ENCOUNTER — Other Ambulatory Visit (HOSPITAL_BASED_OUTPATIENT_CLINIC_OR_DEPARTMENT_OTHER): Payer: Self-pay

## 2021-03-30 ENCOUNTER — Other Ambulatory Visit (HOSPITAL_BASED_OUTPATIENT_CLINIC_OR_DEPARTMENT_OTHER): Payer: Self-pay

## 2021-07-06 ENCOUNTER — Other Ambulatory Visit (HOSPITAL_BASED_OUTPATIENT_CLINIC_OR_DEPARTMENT_OTHER): Payer: Self-pay

## 2021-07-06 ENCOUNTER — Ambulatory Visit: Payer: Self-pay | Attending: Physical Medicine & Rehabilitation | Admitting: Physical Medicine & Rehabilitation

## 2021-07-06 VITALS — BP 127/73 | HR 69 | Temp 97.7°F | Ht 62.0 in | Wt 126.3 lb

## 2021-07-06 DIAGNOSIS — G43709 Chronic migraine without aura, not intractable, without status migrainosus: Secondary | ICD-10-CM | POA: Insufficient documentation

## 2021-07-06 DIAGNOSIS — G35 Multiple sclerosis: Secondary | ICD-10-CM | POA: Insufficient documentation

## 2021-07-06 DIAGNOSIS — H5213 Myopia, bilateral: Secondary | ICD-10-CM | POA: Insufficient documentation

## 2021-07-06 DIAGNOSIS — M1990 Unspecified osteoarthritis, unspecified site: Secondary | ICD-10-CM | POA: Insufficient documentation

## 2021-07-06 DIAGNOSIS — R42 Dizziness and giddiness: Secondary | ICD-10-CM | POA: Insufficient documentation

## 2021-07-06 DIAGNOSIS — M792 Neuralgia and neuritis, unspecified: Secondary | ICD-10-CM | POA: Insufficient documentation

## 2021-07-06 MED ORDER — GABAPENTIN 100 MG OR CAPS
100.0000 mg | ORAL_CAPSULE | Freq: Every evening | ORAL | 11 refills | Status: AC
Start: 2021-07-06 — End: ?
  Filled 2021-07-06: qty 30, 30d supply, fill #0

## 2021-07-06 MED ORDER — MECLIZINE HCL 12.5 MG OR TABS
12.5000 mg | ORAL_TABLET | Freq: Three times a day (TID) | ORAL | 11 refills | Status: AC | PRN
Start: 2021-07-06 — End: ?
  Filled 2021-07-06: qty 30, 10d supply, fill #0

## 2021-07-06 MED ORDER — DULOXETINE HCL 60 MG OR CPEP
60.0000 mg | DELAYED_RELEASE_CAPSULE | Freq: Every day | ORAL | 11 refills | Status: AC
Start: 2021-07-06 — End: ?
  Filled 2021-07-06: qty 30, 30d supply, fill #0

## 2021-07-06 MED ORDER — CELECOXIB 200 MG OR CAPS
200.0000 mg | ORAL_CAPSULE | Freq: Every day | ORAL | 11 refills | Status: AC | PRN
Start: 2021-07-06 — End: ?
  Filled 2021-07-06: qty 30, 30d supply, fill #0

## 2021-07-06 NOTE — Patient Instructions (Addendum)
consulte al optometrista para ver si puede beneficiarse de los anteojos    cuando hace calor, el brazo y la pierna izquierdos pueden sentirse ms entumecidos. Esto ha sido un problema desde al [employer] 2018

## 2021-07-06 NOTE — Progress Notes (Signed)
Physical Medicine and Rehabilitation Note    Chief Complaint: multiple sclerosis  Spanish video interpreter was used    Background neuro history (saw Dr. Elenor Legato):   Diagnosis of RRMS: 2004 based on headaches, balance problems, double vision, MRI brain; no spinal cord lesions; LP neg  Current DMT: None  Past DMT: Betaseron 2004-2010 (stopped after prior neurologist moved away and concern that this is not relapsing MS).    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 headaches. Happened frequently before starting to Betaseron, less while on the drug.         Had weakness and inability to breath on IV steroids x 3 days (?) before  Imaging:    MRI Brain/C/T spine stable 04/2019     MRI brain 01/18: As compared to 11/26/2013, multiple grossly unchanged foci of white matter signal abnormality within the brain, accounting for differences in technique. Relatively nonspecific and could be seen in setting of chronic ischemic vascular disease or demyelinating disease. No abnormal enhancement detected. No PML.     MRI brain 02/15: Stable white matter lesions periventricular and pericallosal.    MRI C spine 01/18: No cervical cord lesions. Stable focal T2 prolongation involving the thoracic spine at T2-T3 seen only on axial imaging and not well correlated on sagittal images. No new lesion or abnormal cord enhancement detected.     MRI C and T spine 04/15: No spinal cord lesions. Hyperintense lesion lower pole right kidney. C4-5 disc protrusion, effaces ventral thecal sac but does not deform the cord    Co-morbidities: positive anti-SSA, osteoporosis (DEXA 10/2018, T score -2.9 at spine, L hip -1.8, L fem neck -2.5)--takes calcium and vitamin D but can't afford additional treatment, asthma  Cholecystectomy.  10/2020 COVID19  Aortic calcification    SH: Spanish speaking. Lives w daughter  and son-in-law. Only son in law working. Daughter has a young daughter.    Results:  -02/2018 XR low back-mild grade 1 anterolisthesis of L5 on S1 and probable osteopenia  -11/09/2018 EMG did not show L median/ulnar neuropathy or C8 radic    05/2020 Pfizer #1 (felt weak for 2 months afterwards, odd sensations in L face, felt weak in whole body)-PCP excused her from further vaccines      Interval history:   -last seen 12/2020. At that time felt SOB since COVID19 in 10/2020, rec to follow up with PCP and increase albuterol as needed, recommended resuming duloxetine for dizziness, rec to follow up with opthal for reading problems and pterygium    In interim-  -02/2021 saw ophthal, rec surgical intervention for pterygium L>R which was worsening, but not falling under criteria of charity care. rec artificial tears. RTC 1 yr with Dr. Lonn Georgia    02/2021 saw Dr. Cecelia Byars rizatriptan, discussed stress incontinence, referred to PT. Consider MRI L spine for intermittent numbness in legs.      -wants gabapentin    -vision is worse but other symptoms are better.  No eye pain  -R eye also blurry and dry.     -uses eye drops bid for dry eyes. Not very helpful for vision. When cooking can't see.   Works part time.    -no more dizziness.   Meclizine prn (2 times a month)    -gabapentin helps her sleep to help with sensations.    -shortness of breath is  better.     -had trouble feeling L leg, started doing exercises.  Feeling better.     -no low back pain.     -leaks w cough, when bladder is full and leaks    -Takes celebrex w food prn  Not really taking ibuprofen unless has milder pain prn.    -Vit D OTC 2000 units daily    ------------------  Background rehab history:  -Mobility: L arm/leg weakness. does lose balance esp crouching (at times falls forward/backwards). Vertigo, eye movements exacerbate, some migrainous component. Meclizine prn.   -ADLS: L hand numbness, easier to drop things from L hand. ADLs/IADLs take longer. Sits for  showering.  -Equipment: no AD.  -Speech/swallow/Respiratory:   -Therapy:   -Exercise:    -heat sensitive  -Fatigue: yes.    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     -left UE/LE pinprick/LT diminished. L hand 3rd-5th fingers altered sensation  -Pain: chronic migraine w vertiginous component. Ice helps. Celebrex 200 mg/ibuprofen prn. L-sided neuropathic pain. Joint pains. Gabapentin 100 mg qhs. Duloxetine 60 mg daily (helped w vertigo).  Past: nortriptyline (made her feel bad, felt short of breath)  -Spasticity: muscle pains likely neuropathic    -Skin:    -Bowel:   -Bladder: Urinary urgency. urinary incontinence. +stress component. Nocturia. Uses pads.  Saw urogyn Dr. Elon Spanner  Past: tolterodine 2 mg bid (was too expensive, didn't take), oxybutynin (didn't work), solifenacin (too expensive)  03/14/17 voided 200 mL, PVR 0 mL.  -gyn: removal of R ovary and half of L ovary for R ovarian cyst. 1987 hysterectomy.    -Vision: 02/2021 saw ophthal, rec surgical intervention for pterygium L>R which was worsening, but not falling under criteria of charity care. rec artificial tears. RTC 1 yr with Dr. Lonn Georgia    109ft Walk Exam  Time (sec): 7.02  Mobility: No Mobility Aids    Symptoms  Fatigue: A little bit  Sleep: Not at all  Double vision: Not at all  Blurry vision: Quite a bit  Swallowing problems: Not at all  Dizziness / light headedness: A little bit  Numbness or tingling or odd sensations: Quite a bit  Pain: Not at all  Weakness: Quite a bit  Falling: Not at all  Spasms or jerking: Not at all  Tightness or stiffness: Not at all  Bladder problems: A little bit  Bowel problems: Not at all  Sexual problems: Not at all  Depression: Not at all  Anxiety: Not at all  Problems thinking: A little bit  Heat sensitivity: Quite a bit  Skin problems (e.g., injection site  reaction): Quite a bit  Heart palpitations: Quite a bit  Shortness of breath: A little bit    Exercise Vitals: Total Minutes of Exercise per Week: (!) 60         Current Outpatient Medications:     albuterol (2.5 MG/3ML) 0.083% nebulizer solution, Inhale 3 mL (2.5 mg) via nebulizer every 4 hours as needed for shortness of breath/wheezing., Disp: 90 mL, Rfl: 0    albuterol HFA 108 (90 Base) MCG/ACT inhaler, Inhale 2 puffs by mouth every 4 hours as needed for shortness of breath/wheezing. For wheezing and/or cough., Disp: 18 g, Rfl: 3    beclomethasone HFA (Qvar RediHaler) 80 MCG/ACT inhaler, Inhale 2 puffs  by mouth 2 times a day., Disp: 10.6 g, Rfl: 1    Biotin 10 MG capsule, Take by mouth., Disp: , Rfl:     celecoxib 200 MG capsule, Take 1 capsule (200 mg) by mouth daily with food as needed for pain., Disp: 30 capsule, Rfl: 11    Cholecalciferol (VITAMIN D) 2000 units Oral Cap, Take 1 capsule (2,000 Units) by mouth daily., Disp: 90 capsule, Rfl: 3    CHOLECALCIFEROL OR, Take 2,000 units by mouth daily., Disp: , Rfl:     Cyanocobalamin (VITAMIN B-12 OR), Take by mouth., Disp: , Rfl:     DULoxetine 60 MG DR capsule, Take 1 capsule (60 mg) by mouth daily., Disp: 30 capsule, Rfl: 11    gabapentin 100 MG capsule, Take 1 capsule (100 mg) by mouth at bedtime as needed for nerve pain., Disp: 30 capsule, Rfl: 11    Ibuprofen (ADVIL OR), , Disp: , Rfl:     MAGNESIUM OR, Take by mouth., Disp: , Rfl:     meclizine 12.5 MG tablet, Take 1 tablet (12.5 mg) by mouth 3 times a day as needed (vertigo)., Disp: 30 tablet, Rfl: 11    POTASSIUM OR, Take by mouth., Disp: , Rfl:     prochlorperazine 5 MG tablet, Take by mouth., Disp: , Rfl:     rizatriptan 10 MG disintegrating tablet, Dissolve 1 tablet (10 mg) on top of tongue and swallow one time as needed for migraines or other (brain pain). May repeat dose once in 2 hours if no relief., Disp: 10 tablet, Rfl: 5    Spacer/Aero-Holding Chambers (AeroChamber MV)  miscellaneous, Use as directed with metered dose inhaler., Disp: 1 each, Rfl: 0      Physical exam  Vitals:    07/06/21 0900   BP: 127/73   Pulse: 69   Temp: 36.5 C     Constit: NAD, seated in office chair.  Eyes: conj clear. R eye ~20/50.  L eye ~20/70.  ENMT: speech clear.  Resp: regular respirations, no acc muscle use.   Abd: non-distended  Mental status: alert, answers questions appropriately.  Psych: very pleasant.    Neuro:  -pinprick: Diminished left upper extremity and left lower extremity.  Preserved in right upper extremity and right lower extremity.  -Tone: Easily ranged x4 extremities  -strength:  Right  SAB5  EF5  EE5  Grip5    Left  SAB4+  EF5  EE5  Grip5    Right  HF5  HAbd5  KE5  DF5  PF5    Left  HF4  HAbd5  KE5  DF5  PF5        Assessment/Plan:  60 yo F with relapsing remitting multiple sclerosis affecting brain and spine, not on DMT.    Multiple sclerosis  -Doing well.  notes everything is stable.  Biggest problem is vision  -Exam is stable  -She had mentioned to Dr. Elenor Legato that her left leg was intermittently.  On exam both the left upper extremity and left lower extremity are numb to pinprick, per review of records this is not new.  Patient denies lumbar spine back pain.  She also notes that the left leg numbness is improved.  We discussed that at times MS symptoms can fluctuate especially can worsen in the summer due to the heat    Bilateral myopia  -complains difficult to read or see pot when cooking.  On exam R eye 20/50, left eye 20/70 approximately  -Saw ophthalmology, unable to get  pterygium surgery for left eye due to does not meet criteria under charity care  rec to follow up in 1 year  -But this does not explain her right eye blurriness.  I therefore referred her to optometry to see if she would benefit from glasses--I suspect myopia.  Given financial limitations, also messaged social work team to see if any resources are available    Neuropathic pain  -Well-controlled.  I  refilled duloxetine and gabapentin.    Chronic migraine with vertigo  -Now that she has resumed her duloxetine, she rarely needs meclizine, and is happy    Arthritis  -Celebrex reordered.  No stomach pain    Follow up 6 months    Histories, medications and problem list have been personally reviewed and updated as appropriate: YES.    TOTAL TIME: I have spent  70 minutes on the patient's care on the date of service, including chart review/pre-charting prior to the visit, face-to-face time with the patient, and documentation and coordination of care after the visit.  7:41 AM to 7:50 AM prechart, chart review  9:14 AM to 10:05 AM office visit  12:31 PM to 12:41 PM finished office documentation

## 2021-07-09 ENCOUNTER — Other Ambulatory Visit (HOSPITAL_BASED_OUTPATIENT_CLINIC_OR_DEPARTMENT_OTHER): Payer: Self-pay

## 2021-07-13 ENCOUNTER — Encounter (HOSPITAL_BASED_OUTPATIENT_CLINIC_OR_DEPARTMENT_OTHER): Payer: Self-pay

## 2021-07-18 ENCOUNTER — Encounter (HOSPITAL_BASED_OUTPATIENT_CLINIC_OR_DEPARTMENT_OTHER): Payer: Self-pay

## 2022-01-24 ENCOUNTER — Encounter (INDEPENDENT_AMBULATORY_CARE_PROVIDER_SITE_OTHER): Payer: Self-pay

## 2022-02-15 ENCOUNTER — Encounter (HOSPITAL_BASED_OUTPATIENT_CLINIC_OR_DEPARTMENT_OTHER): Payer: Self-pay | Admitting: Physical Medicine & Rehabilitation

## 2022-02-15 ENCOUNTER — Encounter (HOSPITAL_BASED_OUTPATIENT_CLINIC_OR_DEPARTMENT_OTHER): Payer: Self-pay

## 2022-02-15 NOTE — Progress Notes (Signed)
Ms. Trecia Sofield did not cancel and was not present for a scheduled appointment today. Notes below were in prep for appointment.  This documentation is for provider reference only for when patient reschedules        Background neuro history (saw Dr. Lorrin Mais):   Diagnosis of RRMS: 2004 based on headaches, balance problems, double vision, MRI brain; no spinal cord lesions; LP neg  Current DMT: None  Past DMT: Betaseron 2004-2010 (stopped after prior neurologist moved away and concern that this is not relapsing MS).    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 headaches. Happened frequently before starting to Betaseron, less while on the drug.         Had weakness and inability to breath on IV steroids x 3 days (?) before  Imaging:    MRI Brain/C/T spine stable 04/2019     MRI brain 01/18: As compared to 11/26/2013, multiple grossly unchanged foci of white matter signal abnormality within the brain, accounting for differences in technique. Relatively nonspecific and could be seen in setting of chronic ischemic vascular disease or demyelinating disease. No abnormal enhancement detected. No PML.     MRI brain 02/15: Stable white matter lesions periventricular and pericallosal.    MRI C spine 01/18: No cervical cord lesions. Stable focal T2 prolongation involving the thoracic spine at T2-T3 seen only on axial imaging and not well correlated on sagittal images. No new lesion or abnormal cord enhancement detected.     MRI C and T spine 04/15: No spinal cord lesions. Hyperintense lesion lower pole right kidney. C4-5 disc protrusion, effaces ventral thecal sac but does not deform the cord    Co-morbidities: positive anti-SSA, osteoporosis (DEXA 10/2018, T score -2.9 at spine, L hip -1.8, L fem neck -2.5)--takes calcium and vitamin D but can't afford additional treatment,  asthma  Cholecystectomy.  10/2020 COVID19  Aortic calcification    SH: Spanish speaking. Lives w daughter and son-in-law. Only son in law working. Daughter has a young daughter.    Results:  -02/2018 XR low back-mild grade 1 anterolisthesis of L5 on S1 and probable osteopenia  -11/09/2018 EMG did not show L median/ulnar neuropathy or C8 radic    05/2020 Pfizer #1 (felt weak for 2 months afterwards, odd sensations in L face, felt weak in whole body)-PCP excused her from further vaccines      Interval history:   -last seen 06/2021. Ref to optometry for R eye blurriness. Migraine better on duloxetine.    In interim-    *recommend new PCP  Dr. Trixie Rude [phone]  *resources for eye exam from [facility]  *eyes    ------------------  Background rehab history:  -Mobility: L arm/leg weakness. does lose balance esp crouching (at times falls forward/backwards). Vertigo, eye movements exacerbate, some migrainous component. Meclizine prn.   -ADLS: L hand numbness, easier to drop things from L hand. ADLs/IADLs take longer. Sits for showering.  -Equipment: no AD.  -Speech/swallow/Respiratory:   -Therapy:   -Exercise:    -heat sensitive  -Fatigue: yes.    Past: was afraid to try amantadine  -Sleep:   -Mood:     -Cognition:  -Work: used to work in a [place]. 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     -left UE/LE pinprick/LT  diminished. L hand 3rd-5th fingers altered sensation  -Pain: chronic migraine w vertiginous component. Ice helps. Celebrex 200 mg/ibuprofen prn. L-sided neuropathic pain. Joint pains.   Gabapentin 100 mg qhs. Duloxetine 60 mg daily (helped w vertigo). Rizatriptan prn.  Past: nortriptyline (made her feel bad, felt short of breath)  -Spasticity: muscle pains likely neuropathic    -Skin:    -Bowel:   -Bladder: Urinary urgency. urinary incontinence. +stress component. Nocturia. Uses pads.  Saw urogyn Dr. Kandis Cocking  Past: tolterodine 2 mg  bid (was too expensive, didn't take), oxybutynin (didn't work), solifenacin (too expensive)  03/14/17 voided 200 mL, PVR 0 mL.  -gyn: removal of R ovary and half of L ovary for R ovarian cyst. 1987 hysterectomy.    -Vision: 02/2021 saw ophthal, rec surgical intervention for pterygium L>R which was worsening, but not falling under criteria of charity care. rec artificial tears. RTC 1 yr with Dr. Letta Moynahan

## 2023-05-23 ENCOUNTER — Telehealth: Payer: Self-pay

## 2023-05-23 NOTE — Telephone Encounter (Signed)
Attempted follow up wellness call of [facility] client with interpreter services no answer, did not leave message will attempt at a later time.  PCP [facility] next appointment 08/05/23   Francee Nodal RN Clara Gunn/[facility]

## 2024-06-28 ENCOUNTER — Other Ambulatory Visit (HOSPITAL_COMMUNITY): Payer: Self-pay | Admitting: Obstetrics and Gynecology

## 2024-06-28 DIAGNOSIS — Z1231 Encounter for screening mammogram for malignant neoplasm of breast: Secondary | ICD-10-CM

## 2024-07-02 ENCOUNTER — Inpatient Hospital Stay: Payer: Self-pay | Attending: Obstetrics and Gynecology | Admitting: *Deleted

## 2024-07-02 ENCOUNTER — Ambulatory Visit (HOSPITAL_COMMUNITY)
Admission: RE | Admit: 2024-07-02 | Discharge: 2024-07-02 | Disposition: A | Discharge status: Home / Self Care | Payer: Self-pay | Source: Ambulatory Visit | Attending: Obstetrics and Gynecology | Admitting: Obstetrics and Gynecology

## 2024-07-02 VITALS — BP 134/52 | Ht 61.0 in | Wt 128.2 lb

## 2024-07-02 DIAGNOSIS — Z1231 Encounter for screening mammogram for malignant neoplasm of breast: Secondary | ICD-10-CM | POA: Insufficient documentation

## 2024-07-02 DIAGNOSIS — Z1239 Encounter for other screening for malignant neoplasm of breast: Secondary | ICD-10-CM

## 2024-07-02 NOTE — Progress Notes (Signed)
 Ms. Ariel Burns is a 63 y.o. female who presents to [facility] clinic today with no complaints.    Pap Smear: Pap smear not completed today. Last Pap smear was in 1987 and was normal per patient. Per patient has no history of an abnormal Pap smear. Patient has a history of a hysterectomy in 1987 due to fibroids and AUB. Patient doesn't need any further Pap smears due to her history of a hysterectomy for benign reasons per BCCCP and ASCCP guidelines.. Last Pap smear result is not available in Epic.   Physical exam: Breasts Breasts symmetrical. No skin abnormalities bilateral breasts. No nipple retraction bilateral breasts. No nipple discharge bilateral breasts. No lymphadenopathy. No lumps palpated bilateral breasts. No complaints of pain or tenderness on exam.  Pelvic/Bimanual Pap is not indicated today per BCCCP guidelines.   Smoking History: Patient has never smoked.   Patient Navigation: Patient education provided. Access to services provided for patient through [facility] program. Spanish interpreter Ariel Burns from [facility] provided.   Colorectal Cancer Screening: Per patient has never had colonoscopy completed. Patient stated she was given a FIT test by her PCP in September 2025 and awaiting results. No complaints today.    Breast and Cervical Cancer Risk Assessment: Patient does not have family history of breast cancer, known genetic mutations, or radiation treatment to the chest before age 61. Patient does not have history of cervical dysplasia, immunocompromised, or DES exposure in-utero.  Risk Scores as of Encounter on 07/02/2024     Ariel Burns           5-year 1.03%   Lifetime 4.45%   This patient is [place] but has no documented birth country, so the [place] model used data from [place] patients to calculate their risk score. Document a birth country in the Demographics activity for a more accurate score.         Last calculated by Ariel Tempie SQUIBB, LPN on 0/87/7974 at  11:27 AM       A: BCCCP exam without pap smear No complaints.  P: Referred patient to [facility] for a screening mammogram. Appointment scheduled Friday, July 02, 2024 at 1145.  Ariel Wanda SQUIBB, RN 07/02/2024 10:34 AM

## 2024-07-02 NOTE — Patient Instructions (Signed)
 Explained breast self awareness with Ariel Burns. Patient did not need a Pap smear today due to patient has a history of a hysterectomy for benign reasons. Let her know that she doesn't need any further Pap smears due to her history of a hysterectomy for benign reasons. Referred patient to [facility] for a screening mammogram. Appointment scheduled Friday, July 02, 2024 at 1145. Patient aware of appointment and will be there. Let patient know [name] will follow up with her within the next couple weeks with results of her mammogram by letter or phone. Ariel Burns verbalized understanding.  Carmel Garfield, Wanda Ship, RN 10:34 AM

## 2024-07-14 ENCOUNTER — Inpatient Hospital Stay
Admission: RE | Admit: 2024-07-14 | Discharge: 2024-07-14 | Disposition: A | Discharge status: Home / Self Care | Payer: Self-pay | Source: Ambulatory Visit | Attending: Obstetrics and Gynecology | Admitting: Obstetrics and Gynecology

## 2024-07-14 ENCOUNTER — Other Ambulatory Visit (HOSPITAL_COMMUNITY): Payer: Self-pay | Admitting: Obstetrics and Gynecology

## 2024-07-14 DIAGNOSIS — Z1231 Encounter for screening mammogram for malignant neoplasm of breast: Secondary | ICD-10-CM
